# Patient Record
Sex: Female | Born: 1945 | Race: White | Hispanic: No | Marital: Married | Smoking: Never smoker
Health system: Southern US, Community
[De-identification: ages and names within clinical notes are randomized; demographics above are authoritative.]

## PROBLEM LIST (undated history)

## (undated) DIAGNOSIS — K219 Gastro-esophageal reflux disease without esophagitis: Secondary | ICD-10-CM

## (undated) DIAGNOSIS — M1990 Unspecified osteoarthritis, unspecified site: Secondary | ICD-10-CM

## (undated) HISTORY — PX: KNEE ARTHROPLASTY: SHX992

---

## 2010-10-28 ENCOUNTER — Other Ambulatory Visit: Payer: Self-pay | Admitting: Neurosurgery

## 2010-10-28 ENCOUNTER — Ambulatory Visit
Admission: RE | Admit: 2010-10-28 | Discharge: 2010-10-28 | Disposition: A | Discharge status: Home / Self Care | Payer: 59 | Source: Ambulatory Visit | Attending: Neurosurgery | Admitting: Neurosurgery

## 2010-10-28 DIAGNOSIS — M545 Low back pain: Secondary | ICD-10-CM

## 2010-11-18 ENCOUNTER — Encounter (HOSPITAL_COMMUNITY)
Admission: RE | Admit: 2010-11-18 | Discharge: 2010-11-18 | Disposition: A | Discharge status: Home / Self Care | Payer: 59 | Source: Ambulatory Visit | Attending: Neurosurgery | Admitting: Neurosurgery

## 2010-11-18 LAB — TYPE AND SCREEN
ABO/RH(D): B POS
Antibody Screen: NEGATIVE

## 2010-11-18 LAB — CBC
MCV: 89.4 fL (ref 78.0–100.0)
Platelets: 267 10*3/uL (ref 150–400)
RDW: 13.1 % (ref 11.5–15.5)
WBC: 7.9 10*3/uL (ref 4.0–10.5)

## 2010-11-21 HISTORY — PX: BACK SURGERY: SHX140

## 2010-11-26 ENCOUNTER — Inpatient Hospital Stay (HOSPITAL_COMMUNITY): Payer: 59

## 2010-11-26 ENCOUNTER — Inpatient Hospital Stay (HOSPITAL_COMMUNITY)
Admission: RE | Admit: 2010-11-26 | Discharge: 2010-12-01 | DRG: 460 | Disposition: A | Discharge status: Home / Self Care | Payer: 59 | Source: Ambulatory Visit | Attending: Neurosurgery | Admitting: Neurosurgery

## 2010-11-26 DIAGNOSIS — Q762 Congenital spondylolisthesis: Secondary | ICD-10-CM

## 2010-11-26 DIAGNOSIS — M5137 Other intervertebral disc degeneration, lumbosacral region: Principal | ICD-10-CM | POA: Diagnosis present

## 2010-11-26 DIAGNOSIS — M129 Arthropathy, unspecified: Secondary | ICD-10-CM | POA: Diagnosis present

## 2010-11-26 DIAGNOSIS — M51379 Other intervertebral disc degeneration, lumbosacral region without mention of lumbar back pain or lower extremity pain: Principal | ICD-10-CM | POA: Diagnosis present

## 2010-11-28 LAB — BASIC METABOLIC PANEL
BUN: 8 mg/dL (ref 6–23)
CO2: 29 mEq/L (ref 19–32)
Chloride: 98 mEq/L (ref 96–112)
Creatinine, Ser: 0.75 mg/dL (ref 0.4–1.2)

## 2010-11-28 LAB — DIFFERENTIAL
Lymphocytes Relative: 25 % (ref 12–46)
Lymphs Abs: 3.4 10*3/uL (ref 0.7–4.0)
Neutrophils Relative %: 63 % (ref 43–77)

## 2010-11-28 LAB — CBC
HCT: 31.2 % — ABNORMAL LOW (ref 36.0–46.0)
Hemoglobin: 10.2 g/dL — ABNORMAL LOW (ref 12.0–15.0)
MCV: 92.3 fL (ref 78.0–100.0)
RBC: 3.38 MIL/uL — ABNORMAL LOW (ref 3.87–5.11)
WBC: 13.6 10*3/uL — ABNORMAL HIGH (ref 4.0–10.5)

## 2010-12-10 NOTE — Op Note (Signed)
Christy Day, BOWLDS NO.:  [id number]  MEDICAL RECORD NO.:  [id number]  LOCATION:  3010                         FACILITY:  [facility]  PHYSICIAN:  Donalee Citrin, M.D.        DATE OF BIRTH:  08-26-1945  DATE OF PROCEDURE:  11/26/2010 DATE OF DISCHARGE:                              OPERATIVE REPORT   PREOPERATIVE DIAGNOSES:  Grade 1/2 spondylolisthesis L4-5, lumbar spinal stenosis L3-4 and L5-S1 with mechanical back pain and lumbar radiculopathy L3, 4, 4, and S1 bilaterally.  POSTOPERATIVE DIAGNOSIS:  Grade 1/2 spondylolisthesis L4-5, lumbar spinal stenosis L3-4 and L5-S1 with mechanical back pain and lumbar radiculopathy L3, 4, 4, and S1 bilaterally.  PROCEDURE:  Gill decompression L4-5, decompressive laminectomies at L3-4 and L5-S1, posterior lumbar interbody fusion L3-4, L4-L5, and L5-S1 using Telamon PEEK cages and tangent allograft wedges, packed with locally harvested autograft mixed with Actifuse and BMP-soaked sponge, pedicle screw fixation L3-S1 using the Globus repair pedicle screw system, posterolateral arthrodesis L3-S1 using locally harvested autograft mixed with Actifuse as well as BMP, open reduction of spinal deformity L4-5, placement of a large Hemovac.  SURGEON:  Donalee Citrin, MD  ASSISTANT:  Kathaleen Maser. Pool, MD.  ANESTHESIA:  General endotracheal.  HISTORY OF PRESENT ILLNESS:  The patient is a very pleasant 65 year old female who has had progressive worsening back pain over the last year. The patient presented with an MRI a year ago that showed spondylosis, however, plain films showed a grade 1/2 spondylolisthesis with minimal movement on flexion and extension.  However, followup MRI scan showed reduction of the deformity, but also marked advancement of facet arthropathy, motor changes, and severe stenosis at 3-4, 4-5, and 5-1, so due to the patient's instability at L4-5, severe stenosis at L3-4 and L5- S1, the patient was recommended decompression  and fusion procedures at L3-4, 4-5, and 5-1.  The patient was explained the risk and benefits of the operation.  She understood and agreed to proceed forth.  PROCEDURE IN DETAIL:  The patient was brought to the OR, induced general anesthesia, positioned prone on the Wilson frame.  Back was prepped and draped in the usual sterile fashion.  Midline incision was made.  Bovie cautery was used to dissect subperiosteal dissection carried to lamina of L3, 4, 5, and S1 exposing the T-piece at L3, 4, 5, and S1 bilaterally.  The spinal laminar complex at L4-5 was noted markedly hypermobile as well as L3-4 and L5-S1, so after intraoperative  x-ray identified appropriate level, spinous processes were removed at L3, L4, and L5.  Central decompression was begun.  There was marked diastasis of facet joints predominantly at L4-5, but also marked facet arthropathy and diastasis at L3-4.  After central decompression was begun, bilateral medial facetectomies performed decompressing the central canal.  Again, marked granulation tissue, extensive amount of fibrosis in the epidural space and foraminotomies were subsequent performed at L3, 4, 5, and S1. L4 foramen was severely stenotic secondary to marked facet arthropathy and listhesis with hypertrophied facet complex overlying the top of the 4 roots bilaterally, so the medial superior aspect of pedicle was drilled down.  The facet complex was aggressively under bitten marching the 4  root significantly out the foramen until adequate decompression had been achieved and complete disarticulation of that facet joint and pars complex had been achieved.  After adequate foraminotomies and decompression had been achieved, centrally, attention was taken to pedicle screw placement.  Using a high-speed drill, pilot holes were drilled at L3, cannulated with the awl probed, tapped with a 5 x 5 tap, probed again and 6.5 x 45 screw inserted on L3 on the right. Fluoroscopy  used each step along the way using internal and external bony landmarks and probing from within the pedicle.  This confirmed the medial and lateral breach.  In a similar fashion, 4, 5, and 1 screws were placed with 6.5 x 45 at L4, 6.5 x 40 at L5, and 6.5 x 40 at S1.  In a similar fashion, left-sided screws were placed also.  After all eight screws were placed,  attention was taken to interbody work using D'Errico to reflect the L5 nerve root medially.  Disk space was incised at L4-5.  Initially, a #8 distractor was inserted.  Again, this level was markedly hypermobile and easily distracted with the #8 distractor. However, due to scalloping of vertebral body, it was felt to be too small for the implant, so contralateral side was prepared and disk space was cleaned out and size 10 distractor was inserted.  This had good apposition of the endplates.  Using size 10 cutting chisel, disk space was cleaned out.  Endplates were prepped.  Large central component disk was removed as well as scraped with Epstein curette.  A Telamon PEEK cage packed with local autograft mixed with Actifuse measuring 10 x 22 was inserted on the patient's left side at L4-5.  Distractor was removed under right fluoroscopy placing the implant and the disk space was cleaned out the right.  Local autograft and BMP sponge were packed anteriorly and centrally, and then a tangent allograft was inserted at L4-5 on the right measuring 10 x 26 mm.  Then attention was taken to L3- 4.  Again, disk space was cleaned out.  Several large osteophytes were removed centrally.  Using size 10 instruments, the endplates were scraped and repaired.  Using tangent autograft wedge on the left, Telamon PEEK cage on the right, and autograft and BMP centrally and.  In a similar fashion, L5-S1 was prepared using an implant.  This disk space was noted to be markedly collapsed with large posterior osteophyte coming out of L5 body.  This was removed  with Kerrison rongeur as well as a chisel to prep the endplates.  Then after adequate prepping of the implant had been achieved, disk space was cleaned out.  An 8 x 22-mm Telamon, 8 x 26 tangent as well as local autograft mixed with Actifuse, and a BMP sponge was packed in L5-S1.  After all the interbody work was done, fluoroscopy confirmed good position of all the implants. Attention was taken to the posterolateral work.  The wound was copiously irrigated.  Meticulous hemostasis was seen.  Aggressive decortication was carried at T-piece and lateral gutters.  BMP sponges and remainder local autograft were mixed with Actifuse and packed posterolaterally. Then, a 100-mm rod was then placed, top tightening nuts were placed. The L5 screws compressed against S1, the L4 compressed against L5, and the L3 compressing against L4.  A crosslink was then applied at L5-S1, then the foramina were all reinspected to confirm patency.  Gelfoam was laid on top of the dura.  Large Hemovac drain was placed.  Wounds were closed in layers with interrupted Vicryl, skin closed with running 4-0 subcuticular.  Benzoin and Steri-Strips were applied.  The patient went to the recovery room in stable condition.  At the end of the case, sponge and instrument counts were correct.          ______________________________ Donalee Citrin, M.D.     GC/MEDQ  D:  11/26/2010  T:  11/27/2010  Job:  [id number]  Electronically Signed by Donalee Citrin M.D. on 12/10/2010 08:46:52 AM

## 2010-12-10 NOTE — Discharge Summary (Signed)
  NAMEENZA, SHONE NO.:  [id number]  MEDICAL RECORD NO.:  [id number]  LOCATION:  3010                         FACILITY:  [facility]  PHYSICIAN:  Donalee Citrin, M.D.        DATE OF BIRTH:  06/26/1945  DATE OF ADMISSION:  11/26/2010 DATE OF DISCHARGE:  12/01/2010                              DISCHARGE SUMMARY   ADMITTING DIAGNOSES:  Degenerative disk disease, lumbar spinal stenosis, grade 2 spondylolisthesis at L4-5, spinal stenosis and degenerative disk disease at L3-4 and L5-S1.  PROCEDURE DURING THIS HOSPITALIZATION:  L3-4, L4-5, and L5-S1 posterior lumbar interbody fusion.  HOSPITAL COURSE:  The patient was admitted and immediately went to the operating room and underwent the aforementioned procedure.  Postop, the patient did very well, recovering on the floor, had a lot of back pain issues, but leg pain was immediately resolved.  The patient slowly and progressively mobilized with physical and occupational therapy and overthe first few days at times was having some difficulty with her brace, so we will be ordering her new brace at the time of discharge, but otherwise was ambulating and voiding spontaneously, had not had a bowel movement.  We are going to send her home with suppositories and schedule a followup in approximately 1 week.  At the time of discharge, the patient was neurologically intact with no leg pain and pain was well controlled on pills.          ______________________________ Donalee Citrin, M.D.     GC/MEDQ  D:  12/01/2010  T:  12/02/2010  Job:  [id number]  Electronically Signed by Donalee Citrin M.D. on 12/10/2010 08:46:56 AM

## 2010-12-15 LAB — POCT I-STAT 4, (NA,K, GLUC, HGB,HCT)
HCT: 36 % (ref 36.0–46.0)
Hemoglobin: 12.2 g/dL (ref 12.0–15.0)
Sodium: 139 mEq/L (ref 135–145)

## 2011-01-01 ENCOUNTER — Other Ambulatory Visit: Payer: Self-pay | Admitting: Neurosurgery

## 2011-01-01 ENCOUNTER — Ambulatory Visit
Admission: RE | Admit: 2011-01-01 | Discharge: 2011-01-01 | Disposition: A | Discharge status: Home / Self Care | Payer: 59 | Source: Ambulatory Visit | Attending: Neurosurgery | Admitting: Neurosurgery

## 2011-01-01 DIAGNOSIS — M5137 Other intervertebral disc degeneration, lumbosacral region: Secondary | ICD-10-CM

## 2011-01-01 DIAGNOSIS — M5126 Other intervertebral disc displacement, lumbar region: Secondary | ICD-10-CM

## 2011-01-01 DIAGNOSIS — M545 Low back pain: Secondary | ICD-10-CM

## 2011-01-01 DIAGNOSIS — M47817 Spondylosis without myelopathy or radiculopathy, lumbosacral region: Secondary | ICD-10-CM

## 2011-02-19 ENCOUNTER — Ambulatory Visit
Admission: RE | Admit: 2011-02-19 | Discharge: 2011-02-19 | Disposition: A | Discharge status: Home / Self Care | Payer: 59 | Source: Ambulatory Visit | Attending: Neurosurgery | Admitting: Neurosurgery

## 2011-02-19 ENCOUNTER — Other Ambulatory Visit: Payer: Self-pay | Admitting: Neurosurgery

## 2011-02-19 DIAGNOSIS — Q762 Congenital spondylolisthesis: Secondary | ICD-10-CM

## 2011-02-19 DIAGNOSIS — M545 Low back pain: Secondary | ICD-10-CM

## 2011-05-26 ENCOUNTER — Ambulatory Visit
Admission: RE | Admit: 2011-05-26 | Discharge: 2011-05-26 | Disposition: A | Discharge status: Home / Self Care | Payer: 59 | Source: Ambulatory Visit | Attending: Neurosurgery | Admitting: Neurosurgery

## 2011-05-26 ENCOUNTER — Other Ambulatory Visit: Payer: Self-pay | Admitting: Neurosurgery

## 2011-05-26 DIAGNOSIS — M549 Dorsalgia, unspecified: Secondary | ICD-10-CM

## 2011-07-14 ENCOUNTER — Other Ambulatory Visit: Payer: Self-pay | Admitting: Neurosurgery

## 2011-07-14 ENCOUNTER — Ambulatory Visit
Admission: RE | Admit: 2011-07-14 | Discharge: 2011-07-14 | Disposition: A | Discharge status: Home / Self Care | Payer: 59 | Source: Ambulatory Visit | Attending: Neurosurgery | Admitting: Neurosurgery

## 2011-07-14 DIAGNOSIS — M545 Low back pain: Secondary | ICD-10-CM

## 2011-07-20 ENCOUNTER — Other Ambulatory Visit (HOSPITAL_BASED_OUTPATIENT_CLINIC_OR_DEPARTMENT_OTHER): Payer: Self-pay | Admitting: Neurosurgery

## 2011-07-20 DIAGNOSIS — M545 Low back pain: Secondary | ICD-10-CM

## 2011-07-21 ENCOUNTER — Other Ambulatory Visit (HOSPITAL_BASED_OUTPATIENT_CLINIC_OR_DEPARTMENT_OTHER): Payer: 59

## 2011-07-21 ENCOUNTER — Ambulatory Visit (HOSPITAL_BASED_OUTPATIENT_CLINIC_OR_DEPARTMENT_OTHER)
Admission: RE | Admit: 2011-07-21 | Discharge: 2011-07-21 | Disposition: A | Discharge status: Home / Self Care | Payer: 59 | Source: Ambulatory Visit | Attending: Neurosurgery | Admitting: Neurosurgery

## 2011-07-21 DIAGNOSIS — M545 Low back pain: Secondary | ICD-10-CM

## 2011-08-07 ENCOUNTER — Encounter (HOSPITAL_COMMUNITY): Payer: Self-pay | Admitting: Pharmacy Technician

## 2011-08-10 NOTE — Progress Notes (Signed)
Called [name] at office for orders

## 2011-08-11 ENCOUNTER — Encounter (HOSPITAL_COMMUNITY)
Admission: RE | Admit: 2011-08-11 | Discharge: 2011-08-11 | Disposition: A | Discharge status: Home / Self Care | Payer: 59 | Source: Ambulatory Visit | Attending: Neurosurgery | Admitting: Neurosurgery

## 2011-08-11 ENCOUNTER — Encounter (HOSPITAL_COMMUNITY): Payer: Self-pay

## 2011-08-11 HISTORY — DX: Gastro-esophageal reflux disease without esophagitis: K21.9

## 2011-08-11 HISTORY — DX: Unspecified osteoarthritis, unspecified site: M19.90

## 2011-08-11 LAB — CBC
HCT: 43.2 % (ref 36.0–46.0)
Hemoglobin: 14.8 g/dL (ref 12.0–15.0)
MCH: 29.6 pg (ref 26.0–34.0)
MCHC: 34.3 g/dL (ref 30.0–36.0)
MCV: 86.4 fL (ref 78.0–100.0)

## 2011-08-11 LAB — BASIC METABOLIC PANEL
BUN: 11 mg/dL (ref 6–23)
Calcium: 10 mg/dL (ref 8.4–10.5)
Creatinine, Ser: 0.81 mg/dL (ref 0.50–1.10)
GFR calc non Af Amer: 75 mL/min — ABNORMAL LOW (ref >90)
Glucose, Bld: 114 mg/dL — ABNORMAL HIGH (ref 70–99)

## 2011-08-11 NOTE — Progress Notes (Signed)
Called for orders. Was told would fax them.

## 2011-08-11 NOTE — Pre-Procedure Instructions (Addendum)
20 Christy Day  08/11/2011   Your procedure is scheduled on:  2.22.13  Report to [facility] at 530* AM.  Call this number if you have problems the morning of surgery: [phone]   Remember:   Do not eat food:After Midnight.  May have clear liquids: up to 4 Hours before arrival.  Clear liquids include soda, tea, black coffee, apple or grape juice, broth.  Take these medicines the morning of surgery with A SIP OF WATERprotonix, hydrocodone   STOP voltaren, multi vit   Do not wear jewelry, make-up or nail polish.  Do not wear lotions, powders, or perfumes. You may wear deodorant.  Do not shave 48 hours prior to surgery.  Do not bring valuables to the hospital.  Contacts, dentures or bridgework may not be worn into surgery.  Leave suitcase in the car. After surgery it may be brought to your room.  For patients admitted to the hospital, checkout time is 11:00 AM the day of discharge.   Patients discharged the day of surgery will not be allowed to drive home.  Name and phone number of your driver:   Special Instructions: CHG Shower Use Special Wash: 1/2 bottle night before surgery and 1/2 bottle morning of surgery.   Please read over the following fact sheets that you were given: Pain Booklet, Coughing and Deep Breathing, MRSA Information and Surgical Site Infection Prevention

## 2011-08-13 MED ORDER — CEFAZOLIN SODIUM-DEXTROSE 2-3 GM-% IV SOLR
2.0000 g | Freq: Once | INTRAVENOUS | Status: DC
Start: 2011-08-14 — End: 2011-08-14
  Filled 2011-08-13: qty 50

## 2011-08-14 ENCOUNTER — Ambulatory Visit (HOSPITAL_COMMUNITY): Payer: 59 | Admitting: Anesthesiology

## 2011-08-14 ENCOUNTER — Encounter (HOSPITAL_COMMUNITY): Payer: Self-pay | Admitting: *Deleted

## 2011-08-14 ENCOUNTER — Encounter (HOSPITAL_COMMUNITY)
Admission: RE | Disposition: A | Discharge status: Home / Self Care | Payer: Self-pay | Source: Ambulatory Visit | Attending: Neurosurgery

## 2011-08-14 ENCOUNTER — Ambulatory Visit (HOSPITAL_COMMUNITY)
Admission: RE | Admit: 2011-08-14 | Discharge: 2011-08-14 | Disposition: A | Discharge status: Home / Self Care | Payer: 59 | Source: Ambulatory Visit | Attending: Neurosurgery | Admitting: Neurosurgery

## 2011-08-14 ENCOUNTER — Encounter (HOSPITAL_COMMUNITY): Payer: Self-pay | Admitting: Anesthesiology

## 2011-08-14 DIAGNOSIS — M5126 Other intervertebral disc displacement, lumbar region: Secondary | ICD-10-CM | POA: Insufficient documentation

## 2011-08-14 DIAGNOSIS — Z01812 Encounter for preprocedural laboratory examination: Secondary | ICD-10-CM | POA: Insufficient documentation

## 2011-08-14 DIAGNOSIS — M5137 Other intervertebral disc degeneration, lumbosacral region: Secondary | ICD-10-CM | POA: Insufficient documentation

## 2011-08-14 DIAGNOSIS — M51379 Other intervertebral disc degeneration, lumbosacral region without mention of lumbar back pain or lower extremity pain: Secondary | ICD-10-CM | POA: Insufficient documentation

## 2011-08-14 SURGERY — POSTERIOR LUMBAR FUSION 2 WITH HARDWARE REMOVAL
Anesthesia: General | Site: Back

## 2011-08-14 SURGICAL SUPPLY — 54 items
BAG DECANTER FOR FLEXI CONT (MISCELLANEOUS) IMPLANT
BENZOIN TINCTURE PRP APPL 2/3 (GAUZE/BANDAGES/DRESSINGS) IMPLANT
BLADE SURG 11 STRL SS (BLADE) IMPLANT
BLADE SURG ROTATE 9660 (MISCELLANEOUS) IMPLANT
BRUSH SCRUB EZ PLAIN DRY (MISCELLANEOUS) IMPLANT
BUR MATCHSTICK NEURO 3.0 LAGG (BURR) IMPLANT
BUR PRECISION FLUTE 6.0 (BURR) IMPLANT
CANISTER SUCTION 2500CC (MISCELLANEOUS) IMPLANT
CLOTH BEACON ORANGE TIMEOUT ST (SAFETY) IMPLANT
CONT SPEC 4OZ CLIKSEAL STRL BL (MISCELLANEOUS) IMPLANT
COVER BACK TABLE 24X17X13 BIG (DRAPES) IMPLANT
COVER TABLE BACK 60X90 (DRAPES) IMPLANT
DECANTER SPIKE VIAL GLASS SM (MISCELLANEOUS) IMPLANT
DERMABOND ADVANCED (GAUZE/BANDAGES/DRESSINGS)
DERMABOND ADVANCED .7 DNX12 (GAUZE/BANDAGES/DRESSINGS) IMPLANT
DRAPE C-ARM 42X72 X-RAY (DRAPES) IMPLANT
DRAPE LAPAROTOMY 100X72X124 (DRAPES) IMPLANT
DRAPE POUCH INSTRU U-SHP 10X18 (DRAPES) IMPLANT
DRAPE PROXIMA HALF (DRAPES) IMPLANT
DRAPE SURG 17X23 STRL (DRAPES) IMPLANT
DRSG OPSITE 4X5.5 SM (GAUZE/BANDAGES/DRESSINGS) IMPLANT
ELECT REM PT RETURN 9FT ADLT (ELECTROSURGICAL)
ELECTRODE REM PT RTRN 9FT ADLT (ELECTROSURGICAL) IMPLANT
EVACUATOR 3/16  PVC DRAIN (DRAIN)
EVACUATOR 3/16 PVC DRAIN (DRAIN) IMPLANT
GAUZE SPONGE 4X4 16PLY XRAY LF (GAUZE/BANDAGES/DRESSINGS) IMPLANT
GLOVE BIO SURGEON STRL SZ8 (GLOVE) IMPLANT
GLOVE EXAM NITRILE LRG STRL (GLOVE) IMPLANT
GLOVE EXAM NITRILE MD LF STRL (GLOVE) IMPLANT
GLOVE EXAM NITRILE XL STR (GLOVE) IMPLANT
GLOVE EXAM NITRILE XS STR PU (GLOVE) IMPLANT
GLOVE INDICATOR 8.5 STRL (GLOVE) IMPLANT
GOWN BRE IMP SLV AUR LG STRL (GOWN DISPOSABLE) IMPLANT
GOWN BRE IMP SLV AUR XL STRL (GOWN DISPOSABLE) ×4 IMPLANT
GOWN STRL REIN 2XL LVL4 (GOWN DISPOSABLE) IMPLANT
KIT BASIN OR (CUSTOM PROCEDURE TRAY) IMPLANT
KIT ROOM TURNOVER OR (KITS) IMPLANT
NEEDLE HYPO 25X1 1.5 SAFETY (NEEDLE) IMPLANT
NS IRRIG 1000ML POUR BTL (IV SOLUTION) IMPLANT
PACK LAMINECTOMY NEURO (CUSTOM PROCEDURE TRAY) IMPLANT
PAD ARMBOARD 7.5X6 YLW CONV (MISCELLANEOUS) IMPLANT
SPONGE GAUZE 4X4 12PLY (GAUZE/BANDAGES/DRESSINGS) IMPLANT
SPONGE LAP 4X18 X RAY DECT (DISPOSABLE) IMPLANT
SPONGE SURGIFOAM ABS GEL 100 (HEMOSTASIS) IMPLANT
STRIP CLOSURE SKIN 1/2X4 (GAUZE/BANDAGES/DRESSINGS) IMPLANT
SUT VIC AB 0 CT1 18XCR BRD8 (SUTURE) IMPLANT
SUT VIC AB 0 CT1 8-18 (SUTURE)
SUT VIC AB 2-0 CT1 18 (SUTURE) IMPLANT
SUT VICRYL 4-0 PS2 18IN ABS (SUTURE) IMPLANT
SYR 20ML ECCENTRIC (SYRINGE) IMPLANT
TOWEL OR 17X24 6PK STRL BLUE (TOWEL DISPOSABLE) IMPLANT
TOWEL OR 17X26 10 PK STRL BLUE (TOWEL DISPOSABLE) IMPLANT
TRAY FOLEY CATH 14FRSI W/METER (CATHETERS) IMPLANT
WATER STERILE IRR 1000ML POUR (IV SOLUTION) IMPLANT

## 2011-08-14 NOTE — Anesthesia Preprocedure Evaluation (Deleted)
Anesthesia Evaluation  Patient identified by MRN, date of birth, ID band Patient awake    Reviewed: Allergy & Precautions, H&P , NPO status , Patient's Chart, lab work & pertinent test results  Airway Mallampati: I TM Distance: >3 FB Neck ROM: Full    Dental  (+) Teeth Intact and Dental Advisory Given   Pulmonary  clear to auscultation        Cardiovascular Regular Normal    Neuro/Psych    GI/Hepatic GERD-  Medicated and Controlled,  Endo/Other  Morbid obesity (BMI > 30)  Renal/GU      Musculoskeletal   Abdominal   Peds  Hematology   Anesthesia Other Findings   Reproductive/Obstetrics                          Anesthesia Physical Anesthesia Plan  ASA: II  Anesthesia Plan: General   Post-op Pain Management:    Induction: Intravenous  Airway Management Planned: Oral ETT  Additional Equipment:   Intra-op Plan:   Post-operative Plan: Extubation in OR  Informed Consent: I have reviewed the patients History and Physical, chart, labs and discussed the procedure including the risks, benefits and alternatives for the proposed anesthesia with the patient or authorized representative who has indicated his/her understanding and acceptance.   Dental advisory given  Plan Discussed with: CRNA, Anesthesiologist and Surgeon  Anesthesia Plan Comments:         Anesthesia Quick Evaluation

## 2011-08-17 ENCOUNTER — Ambulatory Visit (HOSPITAL_COMMUNITY): Payer: 59 | Admitting: Anesthesiology

## 2011-08-17 ENCOUNTER — Encounter (HOSPITAL_COMMUNITY): Payer: Self-pay | Admitting: Anesthesiology

## 2011-08-17 ENCOUNTER — Ambulatory Visit (HOSPITAL_COMMUNITY): Payer: 59

## 2011-08-17 ENCOUNTER — Inpatient Hospital Stay (HOSPITAL_COMMUNITY)
Admission: RE | Admit: 2011-08-17 | Discharge: 2011-08-20 | DRG: 460 | Disposition: A | Discharge status: Home / Self Care | Payer: 59 | Source: Ambulatory Visit | Attending: Neurosurgery | Admitting: Neurosurgery

## 2011-08-17 ENCOUNTER — Encounter (HOSPITAL_COMMUNITY): Payer: Self-pay | Admitting: *Deleted

## 2011-08-17 ENCOUNTER — Encounter (HOSPITAL_COMMUNITY)
Admission: RE | Disposition: A | Discharge status: Home / Self Care | Payer: Self-pay | Source: Ambulatory Visit | Attending: Neurosurgery

## 2011-08-17 DIAGNOSIS — M5126 Other intervertebral disc displacement, lumbar region: Secondary | ICD-10-CM | POA: Diagnosis present

## 2011-08-17 DIAGNOSIS — M5137 Other intervertebral disc degeneration, lumbosacral region: Principal | ICD-10-CM | POA: Diagnosis present

## 2011-08-17 DIAGNOSIS — M51379 Other intervertebral disc degeneration, lumbosacral region without mention of lumbar back pain or lower extremity pain: Principal | ICD-10-CM | POA: Diagnosis present

## 2011-08-17 LAB — TYPE AND SCREEN
ABO/RH(D): B POS
Antibody Screen: NEGATIVE

## 2011-08-17 SURGERY — POSTERIOR LUMBAR FUSION 2 LEVEL
Anesthesia: General | Site: Spine Lumbar | Wound class: Clean

## 2011-08-17 MED ORDER — BUPIVACAINE HCL (PF) 0.25 % IJ SOLN
INTRAMUSCULAR | Status: DC | PRN
  Administered 2011-08-17: 10 mL

## 2011-08-17 MED ORDER — HETASTARCH-ELECTROLYTES 6 % IV SOLN
INTRAVENOUS | Status: DC | PRN
  Administered 2011-08-17 (×2): via INTRAVENOUS

## 2011-08-17 MED ORDER — MIDAZOLAM HCL 5 MG/5ML IJ SOLN
INTRAMUSCULAR | Status: DC | PRN
  Administered 2011-08-17: 2 mg via INTRAVENOUS

## 2011-08-17 MED ORDER — CYCLOBENZAPRINE HCL 10 MG PO TABS
10.0000 mg | ORAL_TABLET | Freq: Three times a day (TID) | ORAL | Status: DC | PRN
  Administered 2011-08-18 – 2011-08-20 (×2): 10 mg via ORAL
  Filled 2011-08-17 (×2): qty 1

## 2011-08-17 MED ORDER — HYDROMORPHONE HCL PF 1 MG/ML IJ SOLN
INTRAMUSCULAR | Status: AC
  Filled 2011-08-17: qty 1

## 2011-08-17 MED ORDER — THROMBIN 20000 UNITS EX KIT
PACK | CUTANEOUS | Status: DC | PRN
  Administered 2011-08-17: 14:00:00 via TOPICAL

## 2011-08-17 MED ORDER — HYDROMORPHONE HCL PF 1 MG/ML IJ SOLN: 0.2500 mg | INTRAMUSCULAR | Status: DC | PRN

## 2011-08-17 MED ORDER — LABETALOL HCL 5 MG/ML IV SOLN
INTRAVENOUS | Status: AC
  Filled 2011-08-17: qty 4

## 2011-08-17 MED ORDER — ONDANSETRON HCL 4 MG/2ML IJ SOLN: 4.0000 mg | Freq: Once | INTRAMUSCULAR | Status: DC | PRN

## 2011-08-17 MED ORDER — LABETALOL HCL 5 MG/ML IV SOLN
5.0000 mg | INTRAVENOUS | Status: DC | PRN
  Administered 2011-08-17: 5 mg via INTRAVENOUS

## 2011-08-17 MED ORDER — HYDROMORPHONE HCL PF 1 MG/ML IJ SOLN
0.5000 mg | INTRAMUSCULAR | Status: DC | PRN
  Administered 2011-08-17 – 2011-08-18 (×2): 1 mg via INTRAVENOUS
  Filled 2011-08-17 (×2): qty 1

## 2011-08-17 MED ORDER — PROMETHAZINE HCL 25 MG/ML IJ SOLN
INTRAMUSCULAR | Status: AC
  Filled 2011-08-17: qty 1

## 2011-08-17 MED ORDER — CEFAZOLIN SODIUM-DEXTROSE 2-3 GM-% IV SOLR
2.0000 g | Freq: Three times a day (TID) | INTRAVENOUS | Status: DC
  Administered 2011-08-17: 2 g via INTRAVENOUS
  Filled 2011-08-17 (×3): qty 50

## 2011-08-17 MED ORDER — ROCURONIUM BROMIDE 100 MG/10ML IV SOLN
INTRAVENOUS | Status: DC | PRN
  Administered 2011-08-17: 50 mg via INTRAVENOUS

## 2011-08-17 MED ORDER — SODIUM CHLORIDE 0.9 % IJ SOLN
3.0000 mL | Freq: Two times a day (BID) | INTRAMUSCULAR | Status: DC
  Administered 2011-08-17 – 2011-08-20 (×6): 3 mL via INTRAVENOUS

## 2011-08-17 MED ORDER — THROMBIN 20000 UNITS EX KIT
PACK | CUTANEOUS | Status: DC | PRN
  Administered 2011-08-17: 15:00:00 via TOPICAL

## 2011-08-17 MED ORDER — SODIUM CHLORIDE 0.9 % IR SOLN
Status: DC | PRN
  Administered 2011-08-17: 14:00:00

## 2011-08-17 MED ORDER — HYDROMORPHONE HCL PF 1 MG/ML IJ SOLN
0.2500 mg | INTRAMUSCULAR | Status: DC | PRN
  Administered 2011-08-17 (×2): 0.5 mg via INTRAVENOUS

## 2011-08-17 MED ORDER — HYDROCODONE-ACETAMINOPHEN 10-325 MG PO TABS: 1.0000 | ORAL_TABLET | ORAL | Status: DC | PRN

## 2011-08-17 MED ORDER — ONDANSETRON HCL 4 MG/2ML IJ SOLN
INTRAMUSCULAR | Status: DC | PRN
  Administered 2011-08-17: 4 mg via INTRAVENOUS

## 2011-08-17 MED ORDER — BACITRACIN 50000 UNITS IM SOLR
INTRAMUSCULAR | Status: AC
  Filled 2011-08-17: qty 1

## 2011-08-17 MED ORDER — PHENOL 1.4 % MT LIQD: 1.0000 | OROMUCOSAL | Status: DC | PRN

## 2011-08-17 MED ORDER — VECURONIUM BROMIDE 10 MG IV SOLR
INTRAVENOUS | Status: DC | PRN
  Administered 2011-08-17 (×7): 1 mg via INTRAVENOUS

## 2011-08-17 MED ORDER — MORPHINE SULFATE 2 MG/ML IJ SOLN: 0.0500 mg/kg | INTRAMUSCULAR | Status: DC | PRN

## 2011-08-17 MED ORDER — PROPOFOL 10 MG/ML IV EMUL
INTRAVENOUS | Status: DC | PRN
  Administered 2011-08-17: 200 mg via INTRAVENOUS

## 2011-08-17 MED ORDER — ACETAMINOPHEN 650 MG RE SUPP: 650.0000 mg | RECTAL | Status: DC | PRN

## 2011-08-17 MED ORDER — CYCLOBENZAPRINE HCL 10 MG PO TABS: 5.0000 mg | ORAL_TABLET | Freq: Three times a day (TID) | ORAL | Status: DC | PRN

## 2011-08-17 MED ORDER — PANTOPRAZOLE SODIUM 40 MG PO TBEC: 40.0000 mg | DELAYED_RELEASE_TABLET | Freq: Every day | ORAL | Status: DC | PRN

## 2011-08-17 MED ORDER — 0.9 % SODIUM CHLORIDE (POUR BTL) OPTIME
TOPICAL | Status: DC | PRN
  Administered 2011-08-17: 1000 mL

## 2011-08-17 MED ORDER — ONDANSETRON HCL 4 MG/2ML IJ SOLN: 4.0000 mg | INTRAMUSCULAR | Status: DC | PRN

## 2011-08-17 MED ORDER — CLONIDINE HCL 0.1 MG PO TABS
0.1000 mg | ORAL_TABLET | Freq: Two times a day (BID) | ORAL | Status: DC
  Administered 2011-08-17: 0.1 mg via ORAL
  Filled 2011-08-17 (×7): qty 1

## 2011-08-17 MED ORDER — DICLOFENAC SODIUM 75 MG PO TBEC
75.0000 mg | DELAYED_RELEASE_TABLET | Freq: Every evening | ORAL | Status: DC
  Administered 2011-08-17 – 2011-08-19 (×3): 75 mg via ORAL
  Filled 2011-08-17 (×6): qty 1

## 2011-08-17 MED ORDER — GLYCOPYRROLATE 0.2 MG/ML IJ SOLN
INTRAMUSCULAR | Status: DC | PRN
  Administered 2011-08-17: .7 mg via INTRAVENOUS

## 2011-08-17 MED ORDER — ADULT MULTIVITAMIN W/MINERALS CH
1.0000 | ORAL_TABLET | Freq: Every day | ORAL | Status: DC
  Administered 2011-08-18 – 2011-08-20 (×3): 1 via ORAL
  Filled 2011-08-17 (×3): qty 1

## 2011-08-17 MED ORDER — NEOSTIGMINE METHYLSULFATE 1 MG/ML IJ SOLN
INTRAMUSCULAR | Status: DC | PRN
  Administered 2011-08-17: 4 mg via INTRAVENOUS

## 2011-08-17 MED ORDER — CEFAZOLIN SODIUM 1-5 GM-% IV SOLN
1.0000 g | Freq: Three times a day (TID) | INTRAVENOUS | Status: AC
  Administered 2011-08-17 – 2011-08-18 (×2): 1 g via INTRAVENOUS
  Filled 2011-08-17 (×2): qty 50

## 2011-08-17 MED ORDER — LACTATED RINGERS IV SOLN
INTRAVENOUS | Status: DC | PRN
  Administered 2011-08-17 (×4): via INTRAVENOUS

## 2011-08-17 MED ORDER — MEPERIDINE HCL 25 MG/ML IJ SOLN: 6.2500 mg | INTRAMUSCULAR | Status: DC | PRN

## 2011-08-17 MED ORDER — OXYCODONE-ACETAMINOPHEN 5-325 MG PO TABS
1.0000 | ORAL_TABLET | ORAL | Status: DC | PRN
  Administered 2011-08-17 – 2011-08-19 (×8): 2 via ORAL
  Filled 2011-08-17 (×8): qty 2

## 2011-08-17 MED ORDER — MORPHINE SULFATE 10 MG/ML IJ SOLN
INTRAMUSCULAR | Status: DC | PRN
  Administered 2011-08-17 (×2): 2 mg via INTRAVENOUS
  Administered 2011-08-17: 4 mg via INTRAVENOUS
  Administered 2011-08-17: 2 mg via INTRAVENOUS

## 2011-08-17 MED ORDER — LIDOCAINE-EPINEPHRINE 1 %-1:100000 IJ SOLN
INTRAMUSCULAR | Status: DC | PRN
  Administered 2011-08-17: 10 mL

## 2011-08-17 MED ORDER — LABETALOL HCL 5 MG/ML IV SOLN: 10.0000 mg | INTRAVENOUS | Status: DC | PRN

## 2011-08-17 MED ORDER — SODIUM CHLORIDE 0.9 % IV SOLN
INTRAVENOUS | Status: AC
  Filled 2011-08-17: qty 500

## 2011-08-17 MED ORDER — ACETAMINOPHEN 325 MG PO TABS: 650.0000 mg | ORAL_TABLET | ORAL | Status: DC | PRN

## 2011-08-17 MED ORDER — FENTANYL CITRATE 0.05 MG/ML IJ SOLN
INTRAMUSCULAR | Status: DC | PRN
  Administered 2011-08-17 (×4): 50 ug via INTRAVENOUS
  Administered 2011-08-17: 250 ug via INTRAVENOUS
  Administered 2011-08-17: 50 ug via INTRAVENOUS

## 2011-08-17 MED ORDER — LACTATED RINGERS IV SOLN: INTRAVENOUS | Status: DC

## 2011-08-17 MED ORDER — PROMETHAZINE HCL 25 MG/ML IJ SOLN
6.2500 mg | INTRAMUSCULAR | Status: DC | PRN
  Administered 2011-08-17: 6.25 mg via INTRAVENOUS

## 2011-08-17 MED ORDER — MENTHOL 3 MG MT LOZG: 1.0000 | LOZENGE | OROMUCOSAL | Status: DC | PRN

## 2011-08-17 SURGICAL SUPPLY — 79 items
3.2X18.3MM HELIOCOIDALO RASP LONG ×2 IMPLANT
BAG DECANTER FOR FLEXI CONT (MISCELLANEOUS) ×2 IMPLANT
BENZOIN TINCTURE PRP APPL 2/3 (GAUZE/BANDAGES/DRESSINGS) ×2 IMPLANT
BLADE SURG 11 STRL SS (BLADE) ×2 IMPLANT
BLADE SURG ROTATE 9660 (MISCELLANEOUS) IMPLANT
BRUSH SCRUB EZ PLAIN DRY (MISCELLANEOUS) ×2 IMPLANT
BUR MATCHSTICK NEURO 3.0 LAGG (BURR) ×2 IMPLANT
BUR PRECISION FLUTE 6.0 (BURR) ×2 IMPLANT
CANISTER SUCTION 2500CC (MISCELLANEOUS) ×2 IMPLANT
CAP REVERE LOCKING (Cap) ×14 IMPLANT
CLOTH BEACON ORANGE TIMEOUT ST (SAFETY) ×2 IMPLANT
CONT SPEC 4OZ CLIKSEAL STRL BL (MISCELLANEOUS) ×4 IMPLANT
COVER BACK TABLE 24X17X13 BIG (DRAPES) IMPLANT
COVER TABLE BACK 60X90 (DRAPES) ×2 IMPLANT
DECANTER SPIKE VIAL GLASS SM (MISCELLANEOUS) ×2 IMPLANT
DERMABOND ADVANCED (GAUZE/BANDAGES/DRESSINGS) ×2
DERMABOND ADVANCED .7 DNX12 (GAUZE/BANDAGES/DRESSINGS) ×2 IMPLANT
DRAPE C-ARM 42X72 X-RAY (DRAPES) ×4 IMPLANT
DRAPE LAPAROTOMY 100X72X124 (DRAPES) ×2 IMPLANT
DRAPE POUCH INSTRU U-SHP 10X18 (DRAPES) ×2 IMPLANT
DRAPE PROXIMA HALF (DRAPES) IMPLANT
DRAPE SURG 17X23 STRL (DRAPES) ×2 IMPLANT
DRSG OPSITE 4X5.5 SM (GAUZE/BANDAGES/DRESSINGS) ×6 IMPLANT
ELECT BLADE 4.0 EZ CLEAN MEGAD (MISCELLANEOUS) ×2
ELECT REM PT RETURN 9FT ADLT (ELECTROSURGICAL) ×2
ELECTRODE BLDE 4.0 EZ CLN MEGD (MISCELLANEOUS) ×1 IMPLANT
ELECTRODE REM PT RTRN 9FT ADLT (ELECTROSURGICAL) ×1 IMPLANT
EVACUATOR 3/16  PVC DRAIN (DRAIN) ×1
EVACUATOR 3/16 PVC DRAIN (DRAIN) ×1 IMPLANT
GAUZE SPONGE 4X4 12PLY STRL LF (GAUZE/BANDAGES/DRESSINGS) ×2 IMPLANT
GAUZE SPONGE 4X4 16PLY XRAY LF (GAUZE/BANDAGES/DRESSINGS) ×4 IMPLANT
GLOVE BIO SURGEON STRL SZ8 (GLOVE) ×8 IMPLANT
GLOVE BIOGEL PI IND STRL 7.5 (GLOVE) ×1 IMPLANT
GLOVE BIOGEL PI IND STRL 8.5 (GLOVE) ×1 IMPLANT
GLOVE BIOGEL PI INDICATOR 7.5 (GLOVE) ×1
GLOVE BIOGEL PI INDICATOR 8.5 (GLOVE) ×1
GLOVE ECLIPSE 7.5 STRL STRAW (GLOVE) ×2 IMPLANT
GLOVE ECLIPSE 8.5 STRL (GLOVE) ×4 IMPLANT
GLOVE EXAM NITRILE LRG STRL (GLOVE) IMPLANT
GLOVE EXAM NITRILE MD LF STRL (GLOVE) IMPLANT
GLOVE EXAM NITRILE XL STR (GLOVE) IMPLANT
GLOVE EXAM NITRILE XS STR PU (GLOVE) IMPLANT
GLOVE INDICATOR 8.5 STRL (GLOVE) ×8 IMPLANT
GLOVE SURG SS PI 8.0 STRL IVOR (GLOVE) ×10 IMPLANT
GOWN BRE IMP SLV AUR LG STRL (GOWN DISPOSABLE) IMPLANT
GOWN BRE IMP SLV AUR XL STRL (GOWN DISPOSABLE) ×14 IMPLANT
GOWN STRL REIN 2XL LVL4 (GOWN DISPOSABLE) ×2 IMPLANT
GRAFT BN 10X1XDBM MAGNIFUSE (Bone Implant) ×1 IMPLANT
GRAFT BONE MAGNIFUSE 1X10CM (Bone Implant) ×1 IMPLANT
KIT BASIN OR (CUSTOM PROCEDURE TRAY) ×2 IMPLANT
KIT INFUSE LRG II (Orthopedic Implant) ×2 IMPLANT
KIT ROOM TURNOVER OR (KITS) ×2 IMPLANT
MARKER SKIN DUAL TIP RULER LAB (MISCELLANEOUS) ×2 IMPLANT
MILL MEDIUM DISP (BLADE) ×2 IMPLANT
NEEDLE HYPO 25X1 1.5 SAFETY (NEEDLE) ×2 IMPLANT
NS IRRIG 1000ML POUR BTL (IV SOLUTION) ×2 IMPLANT
PACK LAMINECTOMY NEURO (CUSTOM PROCEDURE TRAY) ×2 IMPLANT
PAD ARMBOARD 7.5X6 YLW CONV (MISCELLANEOUS) ×12 IMPLANT
PATTIES SURGICAL 1X1 (DISPOSABLE) ×4 IMPLANT
PUTTY BONE DBX 2.5 MIS (Bone Implant) ×2 IMPLANT
ROD REVERE 6.35 STRAIGHT 125MM (Rod) ×2 IMPLANT
ROD REVERE 6.35X85MM (Rod) ×2 IMPLANT
SCREW REVERE 5.5X45 (Screw) ×2 IMPLANT
SCREW REVERE 6.35 6.5X40MM (Screw) ×8 IMPLANT
SPACER CALIBER 10X22MM 10-15MM (Spacer) ×2 IMPLANT
SPACER CALIBER 10X22MM 9-13MM (Spacer) ×2 IMPLANT
SPONGE GAUZE 4X4 12PLY (GAUZE/BANDAGES/DRESSINGS) ×2 IMPLANT
SPONGE LAP 4X18 X RAY DECT (DISPOSABLE) IMPLANT
SPONGE SURGIFOAM ABS GEL 100 (HEMOSTASIS) ×2 IMPLANT
STRIP CLOSURE SKIN 1/2X4 (GAUZE/BANDAGES/DRESSINGS) ×4 IMPLANT
SUT VIC AB 0 CT1 18XCR BRD8 (SUTURE) ×2 IMPLANT
SUT VIC AB 0 CT1 8-18 (SUTURE) ×2
SUT VIC AB 2-0 CT1 18 (SUTURE) ×4 IMPLANT
SUT VICRYL 4-0 PS2 18IN ABS (SUTURE) ×2 IMPLANT
SYR 20ML ECCENTRIC (SYRINGE) ×2 IMPLANT
TOWEL OR 17X24 6PK STRL BLUE (TOWEL DISPOSABLE) ×2 IMPLANT
TOWEL OR 17X26 10 PK STRL BLUE (TOWEL DISPOSABLE) ×4 IMPLANT
TRAY FOLEY CATH 14FRSI W/METER (CATHETERS) ×2 IMPLANT
WATER STERILE IRR 1000ML POUR (IV SOLUTION) ×2 IMPLANT

## 2011-08-17 NOTE — Preoperative (Signed)
Beta Blockers   Reason not to administer Beta Blockers:Not Applicable 

## 2011-08-17 NOTE — Transfer of Care (Signed)
Immediate Anesthesia Transfer of Care Note  Patient: Christy Day  Procedure(s) Performed: Procedure(s) (LRB): POSTERIOR LUMBAR FUSION 2 LEVEL (N/A)  Patient Location: PACU  Anesthesia Type: General  Level of Consciousness: patient cooperative and responds to stimulation  Airway & Oxygen Therapy: Patient Spontanous Breathing and Patient connected to nasal cannula oxygen  Post-op Assessment: Report given to PACU RN and Post -op Vital signs reviewed and stable  Post vital signs: Reviewed  Complications: No apparent anesthesia complications

## 2011-08-17 NOTE — Anesthesia Postprocedure Evaluation (Signed)
  Anesthesia Post-op Note  Patient: Paul Dykes  Procedure(s) Performed: Procedure(s) (LRB): POSTERIOR LUMBAR FUSION 2 LEVEL (N/A)  Patient Location: PACU  Anesthesia Type: General  Level of Consciousness: awake and alert   Airway and Oxygen Therapy: Patient Spontanous Breathing and Patient connected to nasal cannula oxygen  Post-op Pain: mild  Post-op Assessment: Post-op Vital signs reviewed, Patient's Cardiovascular Status Stable, Respiratory Function Stable, Patent Airway and No signs of Nausea or vomiting  Post-op Vital Signs: Reviewed and stable  Complications: No apparent anesthesia complications

## 2011-08-17 NOTE — H&P (Signed)
Christy Day is an 66 y.o. female.   Chief Complaint: Back left leg pain weakness HPI: Patient is a very pleasant 66 year old female who approximate them onto underwent a posterior lumbar in by fusion from L3 down to S1 and did very well with complete resolution of her preoperative leg pain back pain until approximately 1-2 months ago which starts there is an increasing falls and weakness in her left lower extremity. All sutures and numbness in her anterior quad occasion the front of her shin is a swollen L4 nerve root pattern. She 89 right leg symptoms denies any bowel bladder complaints to have significant worsening of her back pain as well. Workup revealed a very large disc herniation at L2-3 above the level of her previous fusion as well as significant significant disease at L1-2 the size and location of the disc herniation aggressive clinical symptoms fracture or acute patient was recommended extension or fusion up to L1 well there is some evidence of an L1-L2 3 fusion with expiration of fusion partial removal of hardware from L3-S1 she understands and agreed to proceed forward.  Past Medical History  Diagnosis Date  . GERD (gastroesophageal reflux disease)   . Arthritis     Past Surgical History  Procedure Date  . Back surgery 6/12  . Knee arthroplasty     2 partials    History reviewed. No pertinent family history. Social History:  does not have a smoking history on file. She does not have any smokeless tobacco history on file. She reports that she does not drink alcohol or use illicit drugs.  Allergies: No Known Allergies  Medications Prior to Admission  Medication Dose Route Frequency Provider Last Rate Last Dose  . DISCONTD: ceFAZolin (ANCEF) IVPB 2 g/50 mL premix  2 g Intravenous Once Christy Dollar, MD      . DISCONTD: HYDROmorphone (DILAUDID) injection 0.25-0.5 mg  0.25-0.5 mg Intravenous Q5 min PRN Christy Nora, MD      . DISCONTD: meperidine (DEMEROL) injection 6.25-12.5 mg   6.25-12.5 mg Intravenous Q5 min PRN Christy Nora, MD      . DISCONTD: morphine 2 MG/ML injection 5.77 mg  0.05 mg/kg Intravenous Q10 min PRN Christy Nora, MD      . DISCONTD: ondansetron [facility]) injection 4 mg  4 mg Intravenous Once PRN Christy Nora, MD       Medications Prior to Admission  Medication Sig Dispense Refill  . cyclobenzaprine (FLEXERIL) 5 MG tablet Take 5 mg by mouth 3 (three) times daily as needed.      . diclofenac (VOLTAREN) 75 MG EC tablet Take 75 mg by mouth every evening.      Marland Kitchen HYDROcodone-acetaminophen (NORCO) 10-325 MG per tablet Take 1 tablet by mouth every 4 (four) hours as needed. As needed for pain.      . Multiple Vitamin (MULITIVITAMIN WITH MINERALS) TABS Take 1 tablet by mouth daily.      Marland Kitchen OVER THE COUNTER MEDICATION Take 1 tablet by mouth daily. Eye Vitamins.      Marland Kitchen OVER THE COUNTER MEDICATION Take 1 tablet by mouth daily. Advanced bone vit      . OVER THE COUNTER MEDICATION Take 1 tablet by mouth daily as needed. Stool Softner as needed for constipation.      . pantoprazole (PROTONIX) 40 MG tablet Take 40 mg by mouth daily as needed. As needed for stomach upset from pain medication.        No results found  for this or any previous visit (from the past 48 hour(s)). No results found.  Review of Systems  Constitutional: Negative.   HENT: Negative.   Eyes: Negative.   Respiratory: Negative.   Cardiovascular: Negative.   Gastrointestinal: Negative.   Genitourinary: Negative.   Musculoskeletal: Positive for myalgias, back pain and falls.  Skin: Negative.   Neurological: Positive for tingling.    Blood pressure 145/89, pulse 85, temperature 98.1 F (36.7 C), temperature source Oral, resp. rate 20, weight 115.4 kg (254 lb 6.6 oz), SpO2 95.00%. Physical Exam  Constitutional: She is oriented to person, place, and time. She appears well-developed and well-nourished.  HENT:  Head: Normocephalic and atraumatic.  Eyes: Pupils are equal, round, and reactive  to light.  Neck: Normal range of motion.  Cardiovascular: Normal rate.   Respiratory: Breath sounds normal.  GI: Soft.  Neurological: She is alert and oriented to person, place, and time. GCS eye subscore is 4. GCS verbal subscore is 5. GCS motor subscore is 6.  Reflex Scores:      Patellar reflexes are 0 on the right side and 0 on the left side.      Achilles reflexes are 0 on the right side and 0 on the left side.      Strength is 5 out of 5 in the right lower extremities in the iliopsoas, quads, rashes, gastrocs, anterior tibialis, EHL. Left looks to me have some weakness at about 4/5 in the quadriceps distally is 5 out of 5.     Assessment/Plan 66 year old female presents for an L1-L3 fusion extending previous L3-S1 fusion. Risks and benefits of the operation, perioperative course, expectations of outcome, alternatives surgery also the patient she understood and agreed to proceed forward.  Christy Day P 08/17/2011, 11:05 AM

## 2011-08-17 NOTE — Anesthesia Preprocedure Evaluation (Addendum)
Anesthesia Evaluation  Patient identified by MRN, date of birth, ID band Patient awake    Reviewed: Allergy & Precautions, H&P , NPO status , Patient's Chart, lab work & pertinent test results  History of Anesthesia Complications Negative for: history of anesthetic complications  Airway Mallampati: III TM Distance: >3 FB Neck ROM: Full    Dental No notable dental hx. (+) Teeth Intact and Dental Advisory Given   Pulmonary neg pulmonary ROS,  clear to auscultation  Pulmonary exam normal       Cardiovascular neg cardio ROS Regular Normal    Neuro/Psych Chronic back pain- narcotic dependent Negative Psych ROS   GI/Hepatic Neg liver ROS, GERD-  Medicated and Controlled,  Endo/Other  Morbid obesity  Renal/GU negative Renal ROS     Musculoskeletal   Abdominal (+) obese,   Peds  Hematology   Anesthesia Other Findings   Reproductive/Obstetrics                          Anesthesia Physical Anesthesia Plan  ASA: II  Anesthesia Plan: General   Post-op Pain Management:    Induction: Intravenous  Airway Management Planned: Oral ETT  Additional Equipment:   Intra-op Plan:   Post-operative Plan: Extubation in OR  Informed Consent: I have reviewed the patients History and Physical, chart, labs and discussed the procedure including the risks, benefits and alternatives for the proposed anesthesia with the patient or authorized representative who has indicated his/her understanding and acceptance.   Dental advisory given  Plan Discussed with: Surgeon and CRNA  Anesthesia Plan Comments: (Plan routine monitors, GETA)        Anesthesia Quick Evaluation

## 2011-08-17 NOTE — Anesthesia Procedure Notes (Signed)
Procedure Name: Intubation Date/Time: 08/17/2011 11:51 AM Performed by: Margaree Mackintosh Pre-anesthesia Checklist: Patient identified, Timeout performed, Emergency Drugs available, Suction available and Patient being monitored Patient Re-evaluated:Patient Re-evaluated prior to inductionOxygen Delivery Method: Circle system utilized Preoxygenation: Pre-oxygenation with 100% oxygen Intubation Type: IV induction Ventilation: Mask ventilation without difficulty and Oral airway inserted - appropriate to patient size Laryngoscope Size: Mac and 4 Grade View: Grade II Tube type: Oral Tube size: 7.5 mm Number of attempts: 1 Airway Equipment and Method: Stylet Placement Confirmation: ETT inserted through vocal cords under direct vision,  positive ETCO2 and breath sounds checked- equal and bilateral Secured at: 22 cm Tube secured with: Tape Dental Injury: Injury to tongue

## 2011-08-17 NOTE — Op Note (Signed)
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 Expiration of fusion and hardware L3-L5 with cutting the rod at L3-4 and the left and L4-5 on the right pedicle screw fixation L1-L3 in the left L1-L4 and the right using the globus Revere pedicle screw system. Posterior lateral arthrodesis L1-L3 using local a graft mixed with DBX and BMP.  Surgeon: Donalee Citrin  Assistant: Sherilyn Cooter pool  Anesthesia: Gen.  EBL: 1 L  History of present illness: Patient is a 66 year old female whose had undergone a L3-S1 fusion back about 8 months ago patient did very well however last one to 2 months of progressively worsening back and pelvic and left leg pain and weakness workup revealed a very large disc herniation migrating caudally from L2-3 disc space behind the L3 vertebral body causing severe thecal sac compression. Also show significant degenerative collapse at L1 to all of progressed since her previous imaging. Clinically patient had weakness of iliopsoas and quadriceps and patient was recommended decompression stabilization procedure from L1-L3 after failure conservative treatment. Risk benefits of the operation, perioperative course, alternatives surgery, expectations of outcome were also the patient she understood and agreed to proceed forward.  Operative procedure: Patient brought into the or was induced under general anesthesia and positioned prone the Wilson frame her back was prepped and draped in routine sterile fashion. Her old incision was opened up extending cephalad and scar tissues dissected free and subperiosteal dissection was carried on the lamina of L1-L2 and L3 bilaterally the hardware was identified and exposed down to L5. Then using height be a high-speed titanium  cutting drill bit rod was cut between L3-4 on the left at L4-5 on the right. This is palatal side and all the fact that she was only 8 months out from her previous fusion although she appeared radiographically have a solid fusion certainly she had some unilateral construct spanning all disc space level the diffusion did appear to be solid from L3 down to S1. At this point the L 2 spinous process was removed central decompression was begun. Complete medial facetectomies were performed was marked stenosis on both 2 and 3 roots and the facets were noted be diastased in full fluid. Foraminotomies of the L2 to 3 roots were carried out extensively. The scar tissues dissected off the L3 nerve root to free up the 3 foramen flush with pedicle. The scar was further taken down allowing the inferior exposure to the removed the ruptured disc fragment on the left L2-3. At the decompression attention was taken to the disc work working on the left side a Views of the limb blade scalpel there was a very large fragment of disc still contained within the ligament using I nerve hook fishing out from behind the 3 body underneath the L3 root flush with the 3 pedicle several large fragments this removed from behind the 3 body. Using a coronary dilator, nerve hook, and hockey-stick this area was explored all way down to just about level a supra-acetabular 34 disc space the in at the end of the discectomy was fragments of his arthrosis of the facet of the L3 nerve as it exited the foramen. Overlie stems were fished out the foramen the medial pedicle as well. Attention at this time was taken to pedicle screw placement oozing AP fluoroscopy to get the entry point to the  L1 pedicle these pedicles were cannulated, probed, tapped with a 55 tap, and 6 5 x 40 mm screw was inserted. Initially a 45 tap and a 45 mm 55 screw was inserted the left L1 however it leg length the screws were removed and placed with a larger and shorter screw. At L3 or  correction L2 6 5 x 40 mm screws were also placed a similar fashion. Attention was then taken the interbody work A. initially an 8 distractor was inserted on the right side the space a 10 distractor was inserted the left side was felt to be appropriate sizing for the implant. Then the distractors removed the right disc spaces cleanout with pituitary rongeurs paddle scrapers rotating cutters and Epstein curettes 89 stable caliber peek cages packed with autograft mixed with DBX and this was inserted on the right side and the disc space and expanded to approximately 12 mm. Distractor was removed and the left-sided spaces cleanout the left central disc was scraped to prepare the endplates to receive the autograft. The remainder the locally harvested our graft mixed with DBX was then packed centrally as well as BMP sponge anteriorly. Then a 10 mm stable cage was inserted on the patient's left side is also expanded up to 12 mm. And then was copiously irrigated meticulous hemostasis was maintained the foramina were reinspected present decortication was care MTPs or lateral gutters. The remainder of the BMP and local autograft and an active use and packed posterior laterally from L1 down to L3 the rods were then sized and selected top tightening nuts were tightened down respectively L2 is compressed against L3 across it was applied L2-3 to all the foramen and they were reamed expected to confirm patency no migration of graft material Gelfoam was overlaid top the dura and muscle for fracture or reapproximated layers after placement of a large Hemovac drain and the skin was closed with a running 4 subcuticular. Steri-Strips applied patient recovered in stable condition. At the end of case of milk and sponge counts were correct per the nurses.

## 2011-08-18 NOTE — Progress Notes (Signed)
Subjective: Patient reports She feels very good she doesn't take any pain medication since 12 midnight she denies any numbness and tingling or legs  Objective: Vital signs in last 24 hours: Temp:  [97.6 F (36.4 C)-98.6 F (37 C)] 98.1 F (36.7 C) (02/26 0600) Pulse Rate:  [65-103] 82  (02/26 0600) Resp:  [14-18] 18  (02/26 0600) BP: (99-198)/(65-93) 99/65 mmHg (02/26 0600) SpO2:  [88 %-97 %] 96 % (02/26 0600) Weight:  [115.4 kg (254 lb 6.6 oz)] 115.4 kg (254 lb 6.6 oz) (02/25 1850)  Intake/Output from previous day: 02/25 0701 - 02/26 0700 In: 5050 [P.O.:590; I.V.:3400; IV Piggyback:1060] Out: 4100 [Urine:2705; Drains:545; Blood:850] Intake/Output this shift:    Strength is 5 out of 5 wound is clean and dry.  Lab Results: No results found for this basename: WBC:2,HGB:2,HCT:2,PLT:2 in the last 72 hours BMET No results found for this basename: NA:2,K:2,CL:2,CO2:2,GLUCOSE:2,BUN:2,CREATININE:2,CALCIUM:2 in the last 72 hours  Studies/Results: Dg Lumbar Spine 2-3 Views  08/17/2011  *RADIOLOGY REPORT*  Clinical Data: L1-2 and L2-3 fusion  LUMBAR SPINE - 2-3 VIEW  Comparison: 07/14/2011  Findings:  there has been previous fusion from L3 to the sacrum. Fusion has extended to involve the L1-2 and L2-3.  There are pedicle screws and posterior rods.  Interbody fusion material is in place at L2-3.  IMPRESSION: Revision and extension of the fusion to include L1-2 and L2-3.  Original Report Authenticated By: Thomasenia Sales, M.D.    Assessment/Plan: Postop day 1 from L1-L3 fusion doing very well progressively mobilized day her Foley is out physical therapy  LOS: 1 day     Christy Day P 08/18/2011, 8:23 AM

## 2011-08-18 NOTE — Evaluation (Signed)
Physical Therapy Evaluation Patient Details Name: Christy Day MRN: [id number] DOB: [birth year] Today's Date: 08/18/2011  Problem List: There is no problem list on file for this patient.   Past Medical History:  Past Medical History  Diagnosis Date  . GERD (gastroesophageal reflux disease)   . Arthritis    Past Surgical History:  Past Surgical History  Procedure Date  . Back surgery 6/12  . Knee arthroplasty     2 partials    PT Assessment/Plan/Recommendation PT Assessment Clinical Impression Statement: Pt is 66 y/o female admitted for lumbar fusion with previous admission in 6/12 for lumbar fusion.  Pt limited due to overall pain and will benefit from acute PT services to improve overall mobility to prepare for safe d/c home. PT Recommendation/Assessment: Patient will need skilled PT in the acute care venue PT Problem List: Decreased strength;Decreased activity tolerance;Decreased mobility;Decreased knowledge of use of DME;Pain PT Therapy Diagnosis : Difficulty walking;Abnormality of gait;Acute pain PT Plan PT Frequency: Min 5X/week PT Treatment/Interventions: DME instruction;Gait training;Stair training;Functional mobility training;Therapeutic activities;Therapeutic exercise;Balance training;Patient/family education PT Recommendation Follow Up Recommendations: No PT follow up Equipment Recommended: None recommended by PT PT Goals  Acute Rehab PT Goals PT Goal Formulation: With patient Time For Goal Achievement: 7 days Pt will Roll Supine to Right Side: with modified independence PT Goal: Rolling Supine to Right Side - Progress: Goal set today Pt will go Supine/Side to Sit: with modified independence PT Goal: Supine/Side to Sit - Progress: Goal set today Pt will go Sit to Supine/Side: with modified independence PT Goal: Sit to Supine/Side - Progress: Goal set today Pt will go Sit to Stand: with modified independence PT Goal: Sit to Stand - Progress: Goal set today Pt  will go Stand to Sit: with modified independence PT Goal: Stand to Sit - Progress: Goal set today Pt will Ambulate: >150 feet;with least restrictive assistive device PT Goal: Ambulate - Progress: Goal set today Pt will Go Up / Down Stairs: Flight;with least restrictive assistive device;with modified independence PT Goal: Up/Down Stairs - Progress: Goal set today  PT Evaluation Precautions/Restrictions  Precautions Precautions: Back Precaution Booklet Issued: Yes (comment) Precaution Comments: Pt needed a reminder of no arching.  Pt able to recall no bending and no twisting from last surgery. Required Braces or Orthoses: No (However pt has brace from home donning brace in standing.) Restrictions Weight Bearing Restrictions: No Prior Functioning  Home Living Lives With: Spouse;Other (Comment) (caregiver for 19 y/o mother) Receives Help From: Family Type of Home: House Home Layout: Two level;1/2 bath on main level Alternate Level Stairs-Rails: Can reach both Alternate Level Stairs-Number of Steps: 15 Home Access: Stairs to enter Entrance Stairs-Rails: None Entrance Stairs-Number of Steps: 1 Bathroom Shower/Tub: Psychologist, counselling;Door [employer] Toilet: Standard Bathroom Accessibility: Yes How Accessible: Accessible via walker Home Adaptive Equipment: Built-in shower seat;Walker - rolling;Straight cane Prior Function Level of Independence: Independent with basic ADLs;Independent with gait;Independent with transfers Able to Take Stairs?: Yes Driving: Yes Cognition Cognition Arousal/Alertness: Awake/alert Overall Cognitive Status: Appears within functional limits for tasks assessed Orientation Level: Oriented X4 Sensation/Coordination Sensation Light Touch: Appears Intact Coordination Gross Motor Movements are Fluid and Coordinated: Yes Extremity Assessment RUE Assessment RUE Assessment: Within Functional Limits LUE Assessment LUE Assessment: Within Functional Limits RLE  Assessment RLE Assessment: Not tested LLE Assessment LLE Assessment: Not tested Mobility (including Balance) Bed Mobility Bed Mobility: Yes Rolling Left: 5: Supervision Rolling Left Details (indicate cue type and reason): VCs for hand placement and proper technique to prevent twisting.  Left Sidelying to Sit: 5: Supervision;With rails;HOB flat Left Sidelying to Sit Details (indicate cue type and reason): VCs to maintain back precautions Transfers Transfers: Yes Sit to Stand: 4: Min assist;With upper extremity assist;From chair/3-in-1 Sit to Stand Details (indicate cue type and reason): (A) to initiate transfer with cues for hand placement Stand to Sit: 4: Min assist;With armrests;To bed Stand to Sit Details: (A) to slowly descend to chair with cues for hand placement. Ambulation/Gait Ambulation/Gait: Yes Ambulation/Gait Assistance:  (minguard) Ambulation Distance (Feet): 100 Feet Assistive device: Rolling walker Gait Pattern: Step-through pattern Gait velocity: decreased Stairs: No  Balance Balance Assessed: Yes Static Sitting Balance Static Sitting - Balance Support: Feet supported Static Sitting - Level of Assistance: 6: Modified independent (Device/Increase time) Static Sitting - Comment/# of Minutes: ~ 5 minutes prior to transfer Exercise    End of Session PT - End of Session Equipment Utilized During Treatment: Gait belt Activity Tolerance: Patient tolerated treatment well Patient left: in chair;with call bell in reach Nurse Communication: Mobility status for transfers;Mobility status for ambulation General Behavior During Session: [facility] for tasks performed Cognition: [facility] for tasks performed  Jakyiah Briones 08/18/2011, 1:13 PM Pager:  [phone]

## 2011-08-18 NOTE — Evaluation (Signed)
Occupational Therapy Evaluation Patient Details Name: Christy Day MRN: [id number] DOB: 01-Dec-1945 Today's Date: 08/18/2011  Problem List: There is no problem list on file for this patient.   Past Medical History:  Past Medical History  Diagnosis Date  . GERD (gastroesophageal reflux disease)   . Arthritis    Past Surgical History:  Past Surgical History  Procedure Date  . Back surgery 6/12  . Knee arthroplasty     2 partials    OT Assessment/Plan/Recommendation OT Assessment Clinical Impression Statement: Pt. s/p L1-L3 fusion and with increased pain. Pt. will benefit from skilled OT to increase functional independence to supervision level at D/C OT Recommendation/Assessment: Patient will need skilled OT in the acute care venue OT Problem List: Decreased safety awareness;Decreased knowledge of use of DME or AE;Decreased knowledge of precautions;Pain;Impaired balance (sitting and/or standing);Decreased activity tolerance Barriers to Discharge: Decreased caregiver support OT Therapy Diagnosis : Acute pain OT Plan OT Frequency: Min 2X/week OT Treatment/Interventions: Self-care/ADL training;DME and/or AE instruction;Patient/family education;Balance training OT Recommendation Follow Up Recommendations: Home health OT;Supervision - Intermittent Equipment Recommended: None recommended by OT Individuals Consulted Consulted and Agree with Results and Recommendations: Patient OT Goals Acute Rehab OT Goals OT Goal Formulation: With patient Time For Goal Achievement: 7 days ADL Goals Pt Will Perform Lower Body Bathing: with set-up;with supervision;Sit to stand from chair;with adaptive equipment ADL Goal: Lower Body Bathing - Progress: Goal set today Pt Will Perform Lower Body Dressing: with set-up;with supervision;Sit to stand from chair;with adaptive equipment ADL Goal: Lower Body Dressing - Progress: Goal set today Pt Will Transfer to Toilet: with set-up;with supervision;with  DME;Ambulation;3-in-1 ADL Goal: Toilet Transfer - Progress: Goal set today Pt Will Perform Toileting - Hygiene: with set-up;with supervision;Sitting on 3-in-1 or toilet ADL Goal: Toileting - Hygiene - Progress: Goal set today Pt Will Perform Tub/Shower Transfer: Shower transfer;with supervision;with DME;Shower seat with back;Maintaining back safety precautions ADL Goal: Web designer - Progress: Goal set today  OT Evaluation Precautions/Restrictions  Precautions Precautions: Back Restrictions Weight Bearing Restrictions: No Prior Functioning Home Living Lives With: Spouse;Other (Comment) (caregiver for 43 y/o mother) Receives Help From: Family Type of Home: House Home Layout: Two level;1/2 bath on main level Alternate Level Stairs-Rails: Can reach both Alternate Level Stairs-Number of Steps: 15 Home Access: Stairs to enter Entrance Stairs-Rails: None Entrance Stairs-Number of Steps: 1 Bathroom Shower/Tub: Psychologist, counselling;Door [employer] Toilet: Standard Bathroom Accessibility: Yes How Accessible: Accessible via walker Home Adaptive Equipment: Built-in shower seat;Walker - rolling;Straight cane Prior Function Level of Independence: Independent with basic ADLs;Independent with gait;Independent with transfers Able to Take Stairs?: Yes Driving: Yes ADL ADL Eating/Feeding: Performed;Independent Where Assessed - Eating/Feeding: Chair Grooming: Performed;Wash/dry hands;Set up;Supervision/safety Grooming Details (indicate cue type and reason): Min verbal cues for safety with RW Where Assessed - Grooming: Standing at sink Upper Body Bathing: Simulated;Chest;Right arm;Left arm;Set up Where Assessed - Upper Body Bathing: Sitting, bed Lower Body Bathing: Simulated;Moderate assistance Where Assessed - Lower Body Bathing: Sit to stand from bed Upper Body Dressing: Performed;Minimal assistance Upper Body Dressing Details (indicate cue type and reason): with donning gown Where  Assessed - Upper Body Dressing: Sitting, bed Lower Body Dressing: Performed;Maximal assistance Lower Body Dressing Details (indicate cue type and reason): With donning bil socks due to pt. unable to reach feet Where Assessed - Lower Body Dressing: Sitting, bed Toilet Transfer: Simulated;Minimal assistance Toilet Transfer Details (indicate cue type and reason): Mod verbal cues for hand placement and technique Toilet Transfer Method: Ambulating Toilet Transfer Equipment: Other (comment) (recliner) Toileting -  Clothing Manipulation: Simulated;Minimal assistance Where Assessed - Toileting Clothing Manipulation: Sit to stand from 3-in-1 or toilet Toileting - Hygiene: Simulated;Minimal assistance Where Assessed - Toileting Hygiene: Sit to stand from 3-in-1 or toilet Tub/Shower Transfer: Not assessed Equipment Used: Rolling walker Ambulation Related to ADLs: Pt. min assist for ~50' with RW and min verbal cues to prevent twisting with turns ADL Comments: Pt. educated on 3/3 back precautions due to pt. only able to recall 2/3 back precautions. Pt. educated on LB ADL techniques to prevent not maintaining precautions.     Extremity Assessment RUE Assessment RUE Assessment: Within Functional Limits LUE Assessment LUE Assessment: Within Functional Limits Mobility  Bed Mobility Bed Mobility: Yes Rolling Left: 5: Supervision Rolling Left Details (indicate cue type and reason): Min verbal cues for reaching with UE Left Sidelying to Sit: 5: Supervision;With rails;HOB flat Left Sidelying to Sit Details (indicate cue type and reason): Min verbal cues for maintaining precautions Transfers Transfers: Yes Sit to Stand: 4: Min assist;With upper extremity assist;From chair/3-in-1 Sit to Stand Details (indicate cue type and reason): Min verbal cues for hand placement on RW and bed    End of Session OT - End of Session Equipment Utilized During Treatment: Gait belt Activity Tolerance: Patient tolerated  treatment well Patient left: in chair;with call bell in reach Nurse Communication: Mobility status for transfers General Behavior During Session: [facility] for tasks performed Cognition: [facility] for tasks performed  Co-evaluation with Flora Lipps, OTR/L Pager [phone] 08/18/2011, 1:01 PM

## 2011-08-19 NOTE — Progress Notes (Signed)
Occupational Therapy Treatment Patient Details Name: Christy Day MRN: [id number] DOB: 1945/11/12 Today's Date: 08/19/2011  OT Assessment/Plan OT Assessment/Plan Comments on Treatment Session: Pt progressing toward goals. Continues to require VC for safe technique during functional transfers. OT Plan: Discharge plan remains appropriate OT Frequency: Min 2X/week Follow Up Recommendations: Home health OT;Supervision - Intermittent Equipment Recommended: None recommended by OT OT Goals ADL Goals Pt Will Transfer to Toilet: with set-up;with supervision;with DME;Ambulation;3-in-1 ADL Goal: Toilet Transfer - Progress: Progressing toward goals  OT Treatment Precautions/Restrictions  Precautions Precautions: Back Required Braces or Orthoses: No (Pt has brace from home donning brace in standing.) Restrictions Weight Bearing Restrictions: No   ADL ADL Toilet Transfer: Performed;Supervision/safety Toilet Transfer Details (indicate cue type and reason): Supervision forr safety.  Min VC for safe hand placement and technique. Toilet Transfer Method: Proofreader: Regular height toilet Equipment Used: Horticulturist, commercial  Bed Mobility Bed Mobility: Yes Rolling Left: 5: Supervision Rolling Left Details (indicate cue type and reason): VC to maintain back precautions Left Sidelying to Sit: 5: Supervision;HOB flat Left Sidelying to Sit Details (indicate cue type and reason): VC to maintain back precautions Transfers Transfers: Yes Sit to Stand: 5: Supervision;From bed;With upper extremity assist;From toilet Sit to Stand Details (indicate cue type and reason): VC for safe hand placement Stand to Sit: 5: Supervision;To chair/3-in-1;To toilet;With upper extremity assist Stand to Sit Details: VC for controlled descent and safe hand placement Exercises    End of Session OT - End of Session Equipment Utilized During Treatment: Back brace Activity Tolerance:  Patient tolerated treatment well Patient left: in chair;with call bell in reach General Behavior During Session: [facility] for tasks performed Cognition: [facility] for tasks performed  Cipriano Mile  08/19/2011, 11:43 AM 08/19/2011 Cipriano Mile OTR/L Pager [phone] Office [phone]

## 2011-08-19 NOTE — Progress Notes (Signed)
Physical Therapy Treatment Patient Details Name: Christy Day MRN: [id number] DOB: 12-31-1945 Today's Date: 08/19/2011  PT Assessment/Plan  PT - Assessment/Plan Comments on Treatment Session: Pt able to increase ambulation distance but continues to need max cues for safety.  Will attempt steps next session.  Unable to perform steps due to fatigue this session. PT Plan: Discharge plan remains appropriate;Frequency remains appropriate PT Frequency: Min 5X/week Follow Up Recommendations: No PT follow up Equipment Recommended: None recommended by PT PT Goals  Acute Rehab PT Goals PT Goal Formulation: With patient Time For Goal Achievement: 7 days Pt will Roll Supine to Right Side: with modified independence PT Goal: Rolling Supine to Right Side - Progress: Progressing toward goal Pt will go Supine/Side to Sit: with modified independence PT Goal: Supine/Side to Sit - Progress: Progressing toward goal Pt will go Sit to Supine/Side: with modified independence PT Goal: Sit to Supine/Side - Progress: Progressing toward goal Pt will go Sit to Stand: with modified independence PT Goal: Sit to Stand - Progress: Progressing toward goal Pt will go Stand to Sit: with modified independence PT Goal: Stand to Sit - Progress: Progressing toward goal Pt will Ambulate: >150 feet;with least restrictive assistive device PT Goal: Ambulate - Progress: Progressing toward goal  PT Treatment Precautions/Restrictions  Precautions Precautions: Back Precaution Booklet Issued: Yes (comment) Precaution Comments: Pt needed a reminder of no arching.  Pt able to recall no bending and no twisting from last surgery. Required Braces or Orthoses: No ((Pt has brace from home donning brace in standing.)) Restrictions Weight Bearing Restrictions: No Mobility (including Balance) Bed Mobility Bed Mobility: Yes Rolling Left: 5: Supervision Rolling Left Details (indicate cue type and reason): VCs for technique to  prevent twisting Left Sidelying to Sit: 5: Supervision;HOB flat Left Sidelying to Sit Details (indicate cue type and reason):  VC to maintain back precautions  Transfers Transfers: Yes Sit to Stand: 5: Supervision;From bed;With upper extremity assist;From toilet Sit to Stand Details (indicate cue type and reason): VC for safe hand placement Stand to Sit: 5: Supervision;To chair/3-in-1;To toilet;With upper extremity assist Stand to Sit Details: VCs for safety with cues for hand placement Ambulation/Gait Ambulation/Gait: Yes Ambulation/Gait Assistance: 5: Supervision Ambulation/Gait Assistance Details (indicate cue type and reason): Supervision for safety with cues to stand upright.  Pt tends to bend forward with fatigue and educated pt on no bending.  Pt needed intermittent rest breaks due to fatigue and UE weakness. Ambulation Distance (Feet): 175 Feet Assistive device: Rolling walker Gait Pattern: Step-through pattern Gait velocity: decreased Stairs: No  Balance Balance Assessed: Yes Static Sitting Balance Static Sitting - Balance Support: Feet supported Static Sitting - Level of Assistance: 6: Modified independent (Device/Increase time) Exercise    End of Session PT - End of Session Equipment Utilized During Treatment: Gait belt Activity Tolerance: Patient tolerated treatment well Patient left: in chair;with call bell in reach Nurse Communication: Mobility status for transfers;Mobility status for ambulation General Behavior During Session: [facility] for tasks performed Cognition: [facility] for tasks performed  Dearl Rudden 08/19/2011, 1:29 PM Pager:  [phone]

## 2011-08-19 NOTE — Progress Notes (Signed)
Subjective: Patient reports She's feeling great she is having no leg pain no new numbness or tingling the medicines are controlling her back pain very well she ambulate quite a bit yesterday and is voiding spontaneously  Objective: Vital signs in last 24 hours: Temp:  [98 F (36.7 C)-98.4 F (36.9 C)] 98.4 F (36.9 C) (02/27 1000) Pulse Rate:  [95-114] 95  (02/27 1000) Resp:  [18-20] 18  (02/27 1000) BP: (90-151)/(56-82) 90/56 mmHg (02/27 1000) SpO2:  [90 %-96 %] 92 % (02/27 1000)  Intake/Output from previous day: 02/26 0701 - 02/27 0700 In: -  Out: 300 [Drains:300] Intake/Output this shift:    Strength is 5 out of 5 wound is clean and dry.  Lab Results: No results found for this basename: WBC:2,HGB:2,HCT:2,PLT:2 in the last 72 hours BMET No results found for this basename: NA:2,K:2,CL:2,CO2:2,GLUCOSE:2,BUN:2,CREATININE:2,CALCIUM:2 in the last 72 hours  Studies/Results: Dg Lumbar Spine 2-3 Views  08/17/2011  *RADIOLOGY REPORT*  Clinical Data: L1-2 and L2-3 fusion  LUMBAR SPINE - 2-3 VIEW  Comparison: 07/14/2011  Findings:  there has been previous fusion from L3 to the sacrum. Fusion has extended to involve the L1-2 and L2-3.  There are pedicle screws and posterior rods.  Interbody fusion material is in place at L2-3.  IMPRESSION: Revision and extension of the fusion to include L1-2 and L2-3.  Original Report Authenticated By: Thomasenia Sales, M.D.    Assessment/Plan: PT today probable discharge tomorrow  LOS: 2 days     Merton Wadlow P 08/19/2011, 3:22 PM

## 2011-08-20 NOTE — Progress Notes (Signed)
Physical Therapy Treatment Patient Details Name: Christy Day MRN: [id number] DOB: May 12, 1946 Today's Date: 08/20/2011  PT Assessment/Plan  PT - Assessment/Plan Comments on Treatment Session: Pt able to perform stair negotiation but continues to need max cues for safe technique.  Pt needs (A) to donn back brace from home in standing however not ordered by MD.  Per pt her mother can (A) to donn brace.  Pt plans to d/c home today with minimal (A) therefore recommend pt to slow down and allow mother to (A) with house work and taking care of dogs. PT Plan: Discharge plan remains appropriate;Frequency remains appropriate PT Frequency: Min 5X/week Follow Up Recommendations: No PT follow up Equipment Recommended: None recommended by PT PT Goals  Acute Rehab PT Goals PT Goal Formulation: With patient Time For Goal Achievement: 7 days Pt will Roll Supine to Right Side: with modified independence PT Goal: Rolling Supine to Right Side - Progress: Progressing toward goal Pt will go Supine/Side to Sit: with modified independence PT Goal: Supine/Side to Sit - Progress: Progressing toward goal Pt will go Sit to Stand: with modified independence PT Goal: Sit to Stand - Progress: Progressing toward goal Pt will go Stand to Sit: with modified independence PT Goal: Stand to Sit - Progress: Progressing toward goal Pt will Ambulate: >150 feet;with least restrictive assistive device PT Goal: Ambulate - Progress: Progressing toward goal Pt will Go Up / Down Stairs: Flight;with least restrictive assistive device;with modified independence PT Goal: Up/Down Stairs - Progress: Progressing toward goal  PT Treatment Precautions/Restrictions  Precautions Precautions: Back Precaution Booklet Issued: Yes (comment) Precaution Comments: Pt continues to need reminders of no bending or twisting throughout session.  Pt at sink and completely bent over to rinse her mouth out.  Max cues for safey and maintain  appropriate position. Required Braces or Orthoses: No Restrictions Weight Bearing Restrictions: No Mobility (including Balance) Bed Mobility Bed Mobility: Yes Rolling Left: 5: Supervision Rolling Left Details (indicate cue type and reason): VCs for technique to prevent twisting Left Sidelying to Sit: 5: Supervision;HOB flat Left Sidelying to Sit Details (indicate cue type and reason): VCs for safety with cues for technqiue  Transfers Transfers: Yes Sit to Stand: 6: Modified independent (Device/Increase time) Stand to Sit: 5: Supervision;To chair/3-in-1;To toilet;With upper extremity assist Stand to Sit Details: VCs for safety Ambulation/Gait Ambulation/Gait: Yes Ambulation/Gait Assistance: 5: Supervision Ambulation/Gait Assistance Details (indicate cue type and reason): Supervision for safety with cues for RW placement.  Pt tends to ambulate fast with decrease awareness of surroundings. Ambulation Distance (Feet): 200 Feet Assistive device: Rolling walker Gait Pattern: Step-through pattern Stairs: Yes Stairs Assistance: 5: Supervision Stairs Assistance Details (indicate cue type and reason): Supervision for safety with cues for proper step sequence.  Stair Management Technique: Two rails;Step to pattern;Forwards Number of Stairs: 10   Balance Balance Assessed: Yes Static Sitting Balance Static Sitting - Balance Support: Feet supported Static Sitting - Level of Assistance: 6: Modified independent (Device/Increase time) Exercise    End of Session PT - End of Session Equipment Utilized During Treatment: Gait belt;Back brace Activity Tolerance: Patient tolerated treatment well Patient left: in chair;with call bell in reach Nurse Communication: Mobility status for transfers;Mobility status for ambulation General Behavior During Session: [facility] for tasks performed Cognition: [facility] for tasks performed  Nikkole Placzek 08/20/2011, 3:22 PM Pager:  [phone]

## 2011-08-20 NOTE — Discharge Instructions (Signed)
No lifting no bending or twisting no driving a riding a car and was found.4 to see the doctor wound clean and dry.

## 2011-08-20 NOTE — Discharge Summary (Signed)
  Physician Discharge Summary  Patient ID: Christy Day MRN: [id number] DOB/AGE: 1945-12-15 66 y.o.  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 *    Discharged Condition: good  Hospital Course: Patient was admitted to the hospital underwent an L1 and L3 fusion with exploration of fusion removal of hardware L3-4 postoperatively patient did very well went to recovery room and then the floor and on the floor patient was convalescing well ambulating and voiding spontaneously tolerating a regular diet by postop day 3 was still be discharged home and scheduled followup in one to 2 weeks.  Consults: Significant Diagnostic Studies: Treatments: Posterior lumbar interbody fusion L2-3 posterior lumbar fusion L1-L3 Discharge Exam: Blood pressure 138/86, pulse 84, temperature 97.3 F (36.3 C), temperature source Oral, resp. rate 19, height 5\' 4"  (1.626 m), weight 115.4 kg (254 lb 6.6 oz), SpO2 95.00%. Strength 5 out of 5 wound clean and dry  Disposition: Home   Medication List  As of 08/20/2011  9:28 AM   TAKE these medications         cyclobenzaprine 5 MG tablet   Commonly known as: FLEXERIL   Take 5 mg by mouth 3 (three) times daily as needed.      diclofenac 75 MG EC tablet   Commonly known as: VOLTAREN   Take 75 mg by mouth every evening.      HYDROcodone-acetaminophen 10-325 MG per tablet   Commonly known as: NORCO   Take 1 tablet by mouth every 4 (four) hours as needed. As needed for pain.      mulitivitamin with minerals Tabs   Take 1 tablet by mouth daily.      OVER THE COUNTER MEDICATION   Take 1 tablet by mouth daily. Eye Vitamins.      OVER THE COUNTER MEDICATION   Take 1 tablet by mouth daily. Advanced bone vit      OVER THE COUNTER MEDICATION   Take 1  tablet by mouth daily as needed. Stool Softner as needed for constipation.      pantoprazole 40 MG tablet   Commonly known as: PROTONIX   Take 40 mg by mouth daily as needed. As needed for stomach upset from pain medication.           Follow-up Information    Follow up in 1 week.         Signed: Jrue Yambao P 08/20/2011, 9:28 AM

## 2011-08-20 NOTE — Progress Notes (Signed)
OT Cancellation Note  Treatment cancelled today due to pt. currently D/C'ing home and declines at this time. Marland Kitchen  Shyleigh Daughtry, OTR/L Pager [phone] 08/20/2011, 2:13 PM

## 2011-08-20 NOTE — Progress Notes (Signed)
Utilization review completed. Gracious Renken, RN, BSN. 08/20/11  

## 2011-08-20 NOTE — Progress Notes (Signed)
Subjective: Patient reports Ms. Shum very well with minimal leg pain back pain well controlled on pills  Objective: Vital signs in last 24 hours: Temp:  [97.3 F (36.3 C)-98.4 F (36.9 C)] 97.3 F (36.3 C) (02/28 0600) Pulse Rate:  [84-96] 84  (02/28 0600) Resp:  [17-20] 19  (02/28 0600) BP: (90-138)/(56-86) 138/86 mmHg (02/28 0600) SpO2:  [92 %-96 %] 95 % (02/28 0600)  Intake/Output from previous day: 02/27 0701 - 02/28 0700 In: 3 [I.V.:3] Out: 80 [Drains:80] Intake/Output this shift:    Out of 5 wound clean and dry  Lab Results: No results found for this basename: WBC:2,HGB:2,HCT:2,PLT:2 in the last 72 hours BMET No results found for this basename: NA:2,K:2,CL:2,CO2:2,GLUCOSE:2,BUN:2,CREATININE:2,CALCIUM:2 in the last 72 hours  Studies/Results: No results found.  Assessment/Plan: Postop day 3 from an L1 L3 fusion doing very well progressive mobilization ready for discharge scheduled followup approximately 1-2 weeks  LOS: 3 days     Ernan Runkles P 08/20/2011, 9:26 AM

## 2012-04-04 ENCOUNTER — Other Ambulatory Visit (HOSPITAL_COMMUNITY)
Admission: RE | Admit: 2012-04-04 | Discharge: 2012-04-04 | Disposition: A | Discharge status: Home / Self Care | Payer: 59 | Source: Ambulatory Visit | Attending: Obstetrics and Gynecology | Admitting: Obstetrics and Gynecology

## 2012-04-04 DIAGNOSIS — Z124 Encounter for screening for malignant neoplasm of cervix: Secondary | ICD-10-CM | POA: Insufficient documentation

## 2013-03-23 ENCOUNTER — Other Ambulatory Visit: Payer: Self-pay | Admitting: Neurosurgery

## 2013-03-23 DIAGNOSIS — M5126 Other intervertebral disc displacement, lumbar region: Secondary | ICD-10-CM

## 2013-04-03 ENCOUNTER — Other Ambulatory Visit: Payer: Self-pay | Admitting: Neurosurgery

## 2013-04-03 DIAGNOSIS — IMO0002 Reserved for concepts with insufficient information to code with codable children: Secondary | ICD-10-CM

## 2013-04-04 ENCOUNTER — Ambulatory Visit
Admission: RE | Admit: 2013-04-04 | Discharge: 2013-04-04 | Disposition: A | Discharge status: Home / Self Care | Payer: Medicare Other | Source: Ambulatory Visit | Attending: Neurosurgery | Admitting: Neurosurgery

## 2013-04-04 DIAGNOSIS — IMO0002 Reserved for concepts with insufficient information to code with codable children: Secondary | ICD-10-CM

## 2013-10-19 ENCOUNTER — Other Ambulatory Visit: Payer: Self-pay | Admitting: Neurosurgery

## 2013-10-19 DIAGNOSIS — M545 Low back pain, unspecified: Secondary | ICD-10-CM

## 2013-10-19 DIAGNOSIS — M5430 Sciatica, unspecified side: Secondary | ICD-10-CM

## 2013-10-31 ENCOUNTER — Other Ambulatory Visit: Payer: Medicare Other

## 2014-07-05 ENCOUNTER — Encounter (HOSPITAL_COMMUNITY): Payer: Self-pay | Admitting: Neurosurgery

## 2015-04-02 IMAGING — CT CT L SPINE W/O CM
4 of 11 series · 13 of 33 positions shown, 15 images · non-contrast
Comparison: Office radiographs [DATE]. Outside MRI 03/28/2013 from
[HOSPITAL].

CLINICAL DATA: Right hip and leg pain for 1 month. History of L3-S1
fusion in November 2010 with subsequent revision, superiorly extending
the fusion to L1 on 08/17/2011.

EXAM:
CT LUMBAR SPINE WITHOUT CONTRAST
TECHNIQUE: Multidetector CT imaging of the lumbar spine was performed without
intravenous contrast administration. Multiplanar CT image
reconstructions were also generated.

[Series 2: l spine bone · axial · 0.27mm/px · z∈[-311,-76]mm · 3 of 95 slices shown, 4 images]
[im 1/95  soft-tissue]
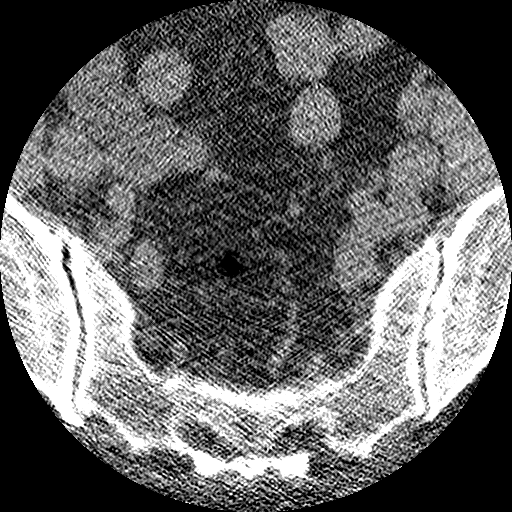
[im 1/95  bone]
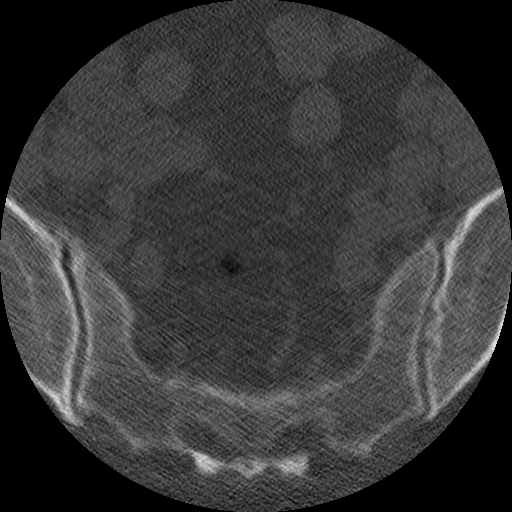
[im 48/95  bone]
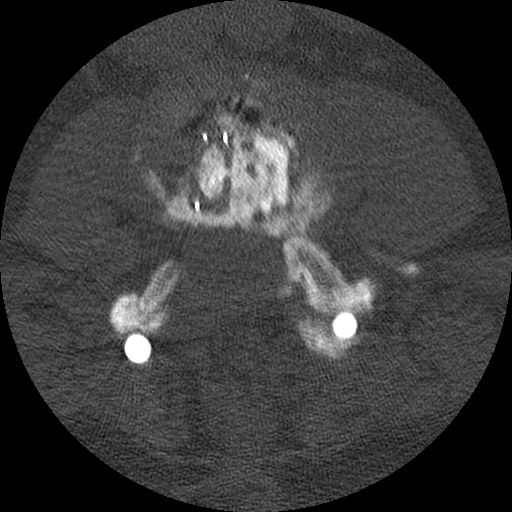
[im 95/95  bone]
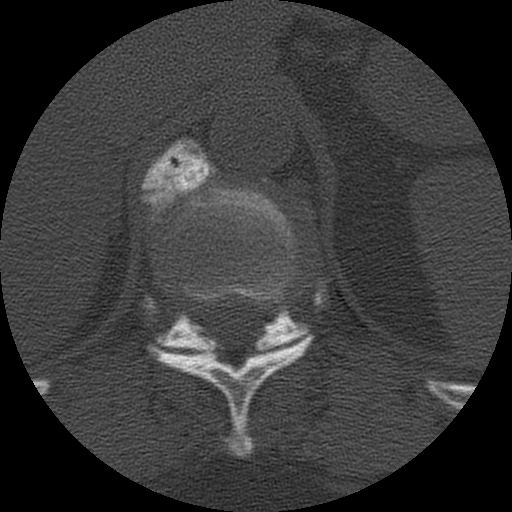

[Series 3: l spine soft · axial · 0.27mm/px · z∈[-231,-154]mm · 2 of 94 slices shown]
[im 32/94  soft-tissue]
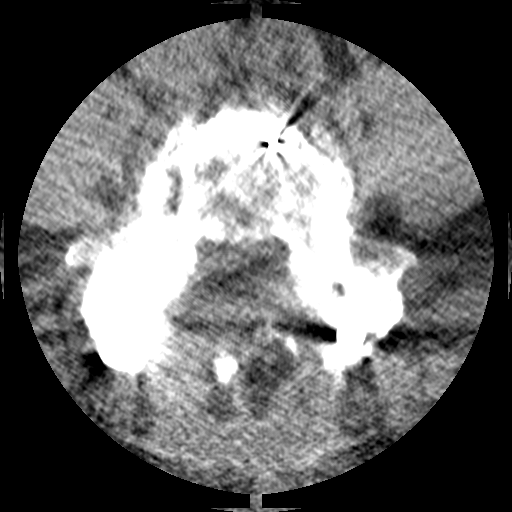
[im 63/94  soft-tissue]
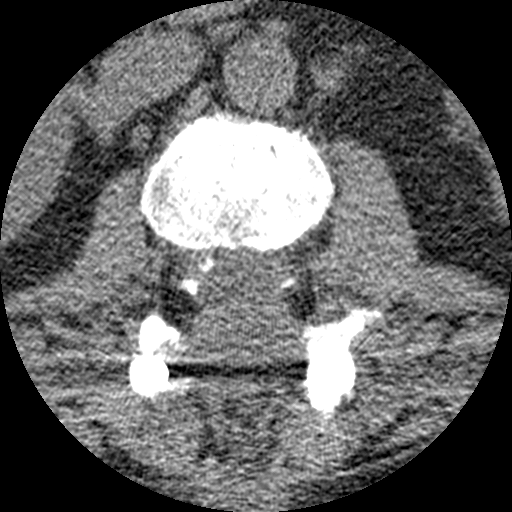

[Series 401: cor lower · coronal · 0.47mm/px · 3 of 64 slices shown]
[im 3/64  bone]
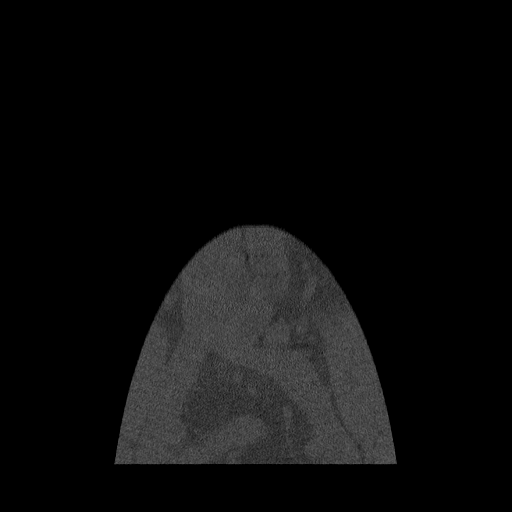
[im 32/64  bone]
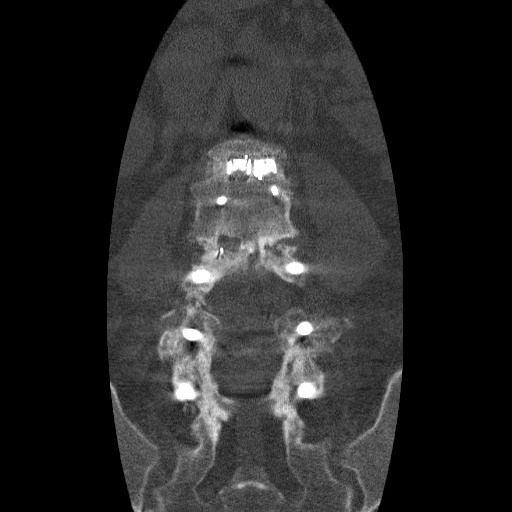
[im 62/64  bone]
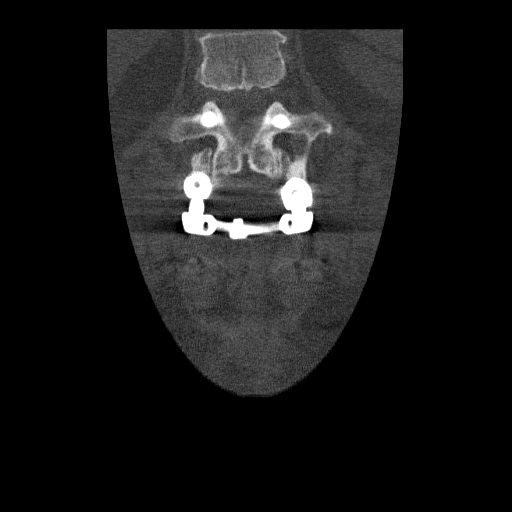

[Series 402: sag · sagittal · 0.47mm/px · 5 of 63 slices shown, 6 images]
[im 21/63  bone]
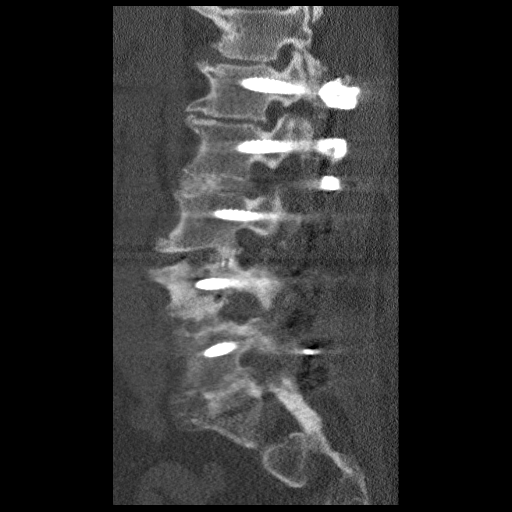
[im 26/63  bone]
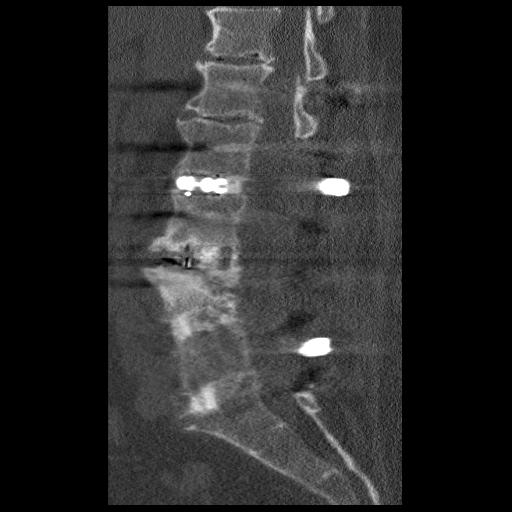
[im 32/63  soft-tissue]
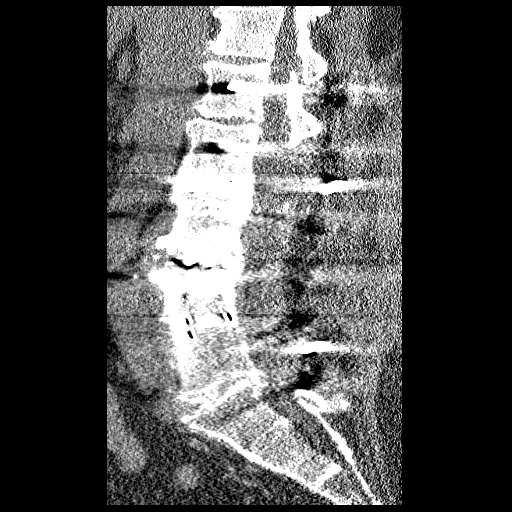
[im 32/63  bone]
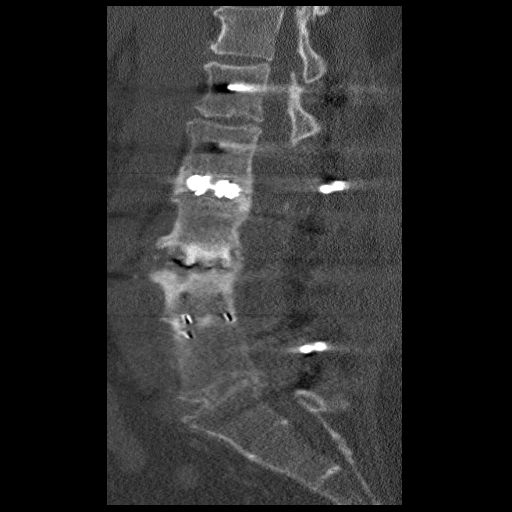
[im 37/63  bone]
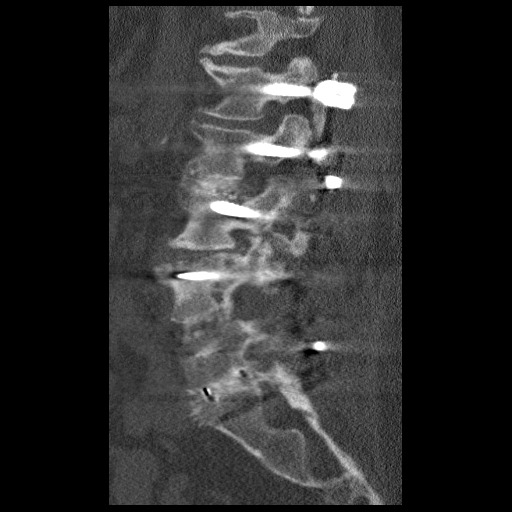
[im 42/63  bone]
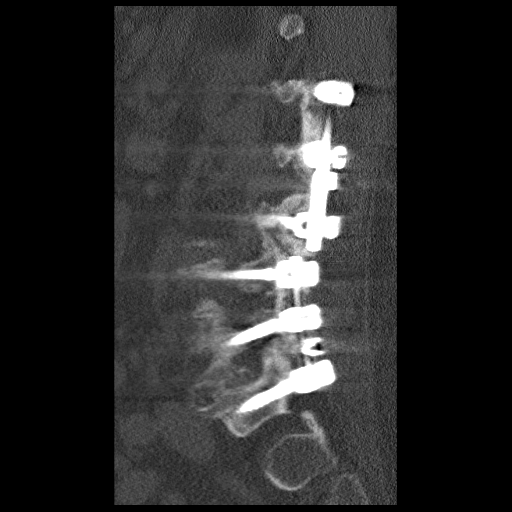

[13 of 33 positions shown; findings below may reference images not displayed]

FINDINGS: There are postsurgical changes status post L1-S1 fusion. Pedicle
screws are present at all levels with interconnecting rods on the
right extending from L1 through L4 and at L5-S1. The interconnecting
rods on the left extend from L1 through L3 and from L4 through S1.
No hardware loosening is identified. There is solid interbody fusion
at the L2-3, L4-5 and L5-S1 levels. At L3-4, there is some vacuum
phenomena along the inferior left aspect of the interbody spacer.
However, there appears to be partial solid interbody fusion on the
right.

No acute paraspinal abnormalities are identified. The liver
demonstrates low density consistent with steatosis. There is an
indeterminate right adrenal nodule measuring 2.2 cm and 44
Hounsfield units.

T12-L1: There is asymmetric disc bulging on the right with anterior
osteophytes. There is mild resulting right foraminal stenosis with
possible right T12 nerve root encroachment. The left foramen is
patent.

L1-2: Prior posterolateral fusion. There is disc bulging with small
posterior osteophytes. No spinal stenosis or nerve root
encroachment.

L2-3: Prior laminectomy and PLIF. No spinal stenosis or nerve root
encroachment.

L3-4: Status post laminectomy and PLIF. The spinal canal is well
decompressed. There are residual posterior osteophytes on the left
contributing to residual moderate left foraminal stenosis. There is
no right foraminal stenosis.

L4-5: Status post laminectomy and PLIF. The spinal canal is well
decompressed. There is no foraminal compromise.

L5-S1: Status post laminectomy and PLIF. No spinal stenosis or nerve
root encroachment.
IMPRESSION: 1. Postsurgical findings related to prior L1-S1 fusion. There is
incomplete but partial interbody fusion at L3-4. No hardware
loosening identified.
2. Asymmetric disc bulging on the right at T12-L1 narrows the right
foramen and could contribute to right T12 nerve root encroachment.
3. Residual posterior osteophytes on the left at L3-4 contribute to
residual moderate left foraminal stenosis. No other significant
foraminal compromise identified.
4. Right adrenal nodule demonstrates an indeterminate density on CT,
but is statistically an adenoma. If this has not been documented on
older prior studies, follow up should be considered to assess
stability.
